# Patient Record
Sex: Male | Born: 1945
Health system: Southern US, Community
[De-identification: ages and names within clinical notes are randomized; demographics above are authoritative.]

## PROBLEM LIST (undated history)

## (undated) DIAGNOSIS — G250 Essential tremor: Secondary | ICD-10-CM

## (undated) DIAGNOSIS — E785 Hyperlipidemia, unspecified: Secondary | ICD-10-CM

## (undated) DIAGNOSIS — Z86010 Personal history of colonic polyps: Secondary | ICD-10-CM

## (undated) DIAGNOSIS — K279 Peptic ulcer, site unspecified, unspecified as acute or chronic, without hemorrhage or perforation: Secondary | ICD-10-CM

## (undated) DIAGNOSIS — Z85038 Personal history of other malignant neoplasm of large intestine: Secondary | ICD-10-CM

## (undated) DIAGNOSIS — K625 Hemorrhage of anus and rectum: Secondary | ICD-10-CM

## (undated) DIAGNOSIS — A809 Acute poliomyelitis, unspecified: Secondary | ICD-10-CM

## (undated) DIAGNOSIS — C801 Malignant (primary) neoplasm, unspecified: Secondary | ICD-10-CM

## (undated) DIAGNOSIS — N1831 Chronic kidney disease, stage 3a: Secondary | ICD-10-CM

## (undated) DIAGNOSIS — K769 Liver disease, unspecified: Secondary | ICD-10-CM

## (undated) DIAGNOSIS — G43909 Migraine, unspecified, not intractable, without status migrainosus: Secondary | ICD-10-CM

## (undated) DIAGNOSIS — I10 Essential (primary) hypertension: Secondary | ICD-10-CM

## (undated) DIAGNOSIS — E7800 Pure hypercholesterolemia, unspecified: Secondary | ICD-10-CM

## (undated) HISTORY — DX: Migraine, unspecified, not intractable, without status migrainosus: G43.909

## (undated) HISTORY — DX: Chronic kidney disease, stage 3a: N18.31

## (undated) HISTORY — DX: Essential tremor: G25.0

## (undated) HISTORY — PX: COLON SURGERY: SHX602

## (undated) HISTORY — DX: Peptic ulcer, site unspecified, unspecified as acute or chronic, without hemorrhage or perforation: K27.9

## (undated) HISTORY — DX: Hemorrhage of anus and rectum: K62.5

## (undated) HISTORY — DX: Acute poliomyelitis, unspecified: A80.9

## (undated) HISTORY — DX: Personal history of colonic polyps: Z86.010

## (undated) HISTORY — DX: Pure hypercholesterolemia, unspecified: E78.00

## (undated) HISTORY — DX: Hyperlipidemia, unspecified: E78.5

## (undated) HISTORY — DX: Liver disease, unspecified: K76.9

## (undated) HISTORY — DX: Personal history of other malignant neoplasm of large intestine: Z85.038

---

## 2006-03-24 ENCOUNTER — Inpatient Hospital Stay (HOSPITAL_COMMUNITY): Admission: EM | Admit: 2006-03-24 | Discharge: 2006-03-27 | Payer: Self-pay | Admitting: Emergency Medicine

## 2006-03-26 ENCOUNTER — Encounter (INDEPENDENT_AMBULATORY_CARE_PROVIDER_SITE_OTHER): Payer: Self-pay | Admitting: *Deleted

## 2006-06-23 ENCOUNTER — Encounter (INDEPENDENT_AMBULATORY_CARE_PROVIDER_SITE_OTHER): Payer: Self-pay | Admitting: Surgery

## 2006-06-23 ENCOUNTER — Inpatient Hospital Stay (HOSPITAL_COMMUNITY): Admission: RE | Admit: 2006-06-23 | Discharge: 2006-06-28 | Payer: Self-pay | Admitting: Surgery

## 2006-06-23 ENCOUNTER — Ambulatory Visit: Payer: Self-pay | Admitting: Internal Medicine

## 2006-06-27 ENCOUNTER — Ambulatory Visit: Payer: Self-pay | Admitting: Internal Medicine

## 2006-06-30 ENCOUNTER — Ambulatory Visit: Payer: Self-pay | Admitting: Oncology

## 2006-07-04 LAB — BUN: BUN: 18 mg/dL (ref 6–23)

## 2006-07-04 LAB — CREATININE, SERUM: Creatinine, Ser: 0.97 mg/dL (ref 0.40–1.50)

## 2006-07-08 ENCOUNTER — Ambulatory Visit (HOSPITAL_COMMUNITY): Admission: RE | Admit: 2006-07-08 | Discharge: 2006-07-08 | Payer: Self-pay | Admitting: Internal Medicine

## 2006-10-02 ENCOUNTER — Ambulatory Visit: Payer: Self-pay | Admitting: Internal Medicine

## 2006-10-06 LAB — CBC WITH DIFFERENTIAL/PLATELET
BASO%: 1.5 % (ref 0.0–2.0)
EOS%: 2.5 % (ref 0.0–7.0)
HCT: 35.9 % — ABNORMAL LOW (ref 38.7–49.9)
MCH: 23.7 pg — ABNORMAL LOW (ref 28.0–33.4)
MCHC: 32.5 g/dL (ref 32.0–35.9)
MONO#: 0.5 10*3/uL (ref 0.1–0.9)
RBC: 4.92 10*6/uL (ref 4.20–5.71)
RDW: 20.8 % — ABNORMAL HIGH (ref 11.2–14.6)
WBC: 5.8 10*3/uL (ref 4.0–10.0)
lymph#: 1.1 10*3/uL (ref 0.9–3.3)

## 2006-10-07 ENCOUNTER — Ambulatory Visit (HOSPITAL_COMMUNITY): Admission: RE | Admit: 2006-10-07 | Discharge: 2006-10-07 | Payer: Self-pay | Admitting: Internal Medicine

## 2006-10-07 LAB — COMPREHENSIVE METABOLIC PANEL
ALT: 12 U/L (ref 0–53)
AST: 14 U/L (ref 0–37)
BUN: 20 mg/dL (ref 6–23)
Calcium: 9 mg/dL (ref 8.4–10.5)
Creatinine, Ser: 0.96 mg/dL (ref 0.40–1.50)
Total Bilirubin: 0.3 mg/dL (ref 0.3–1.2)

## 2006-10-07 LAB — CEA: CEA: 1.1 ng/mL (ref 0.0–5.0)

## 2007-01-01 ENCOUNTER — Ambulatory Visit: Payer: Self-pay | Admitting: Internal Medicine

## 2007-01-05 LAB — COMPREHENSIVE METABOLIC PANEL
Albumin: 4 g/dL (ref 3.5–5.2)
BUN: 21 mg/dL (ref 6–23)
Calcium: 9.4 mg/dL (ref 8.4–10.5)
Chloride: 107 mEq/L (ref 96–112)
Creatinine, Ser: 0.99 mg/dL (ref 0.40–1.50)
Glucose, Bld: 88 mg/dL (ref 70–99)
Potassium: 4.4 mEq/L (ref 3.5–5.3)

## 2007-01-05 LAB — CBC WITH DIFFERENTIAL/PLATELET
Basophils Absolute: 0.1 10*3/uL (ref 0.0–0.1)
EOS%: 1.5 % (ref 0.0–7.0)
Eosinophils Absolute: 0.1 10*3/uL (ref 0.0–0.5)
HCT: 41.6 % (ref 38.7–49.9)
HGB: 14.1 g/dL (ref 13.0–17.1)
MCH: 27.2 pg — ABNORMAL LOW (ref 28.0–33.4)
MCV: 80.2 fL — ABNORMAL LOW (ref 81.6–98.0)
NEUT#: 3.8 10*3/uL (ref 1.5–6.5)
NEUT%: 67.4 % (ref 40.0–75.0)
RDW: 19.1 % — ABNORMAL HIGH (ref 11.2–14.6)
lymph#: 1.1 10*3/uL (ref 0.9–3.3)

## 2007-01-05 LAB — CEA: CEA: 0.7 ng/mL (ref 0.0–5.0)

## 2007-04-06 ENCOUNTER — Ambulatory Visit: Payer: Self-pay | Admitting: Internal Medicine

## 2007-04-08 ENCOUNTER — Ambulatory Visit (HOSPITAL_COMMUNITY): Admission: RE | Admit: 2007-04-08 | Discharge: 2007-04-08 | Payer: Self-pay | Admitting: Internal Medicine

## 2007-04-08 LAB — CBC WITH DIFFERENTIAL/PLATELET
Basophils Absolute: 0.1 10*3/uL (ref 0.0–0.1)
EOS%: 3.2 % (ref 0.0–7.0)
Eosinophils Absolute: 0.2 10*3/uL (ref 0.0–0.5)
HCT: 43.5 % (ref 38.7–49.9)
HGB: 14.8 g/dL (ref 13.0–17.1)
MCH: 28.8 pg (ref 28.0–33.4)
MCV: 84.8 fL (ref 81.6–98.0)
NEUT#: 3.6 10*3/uL (ref 1.5–6.5)
NEUT%: 60.5 % (ref 40.0–75.0)
lymph#: 1.6 10*3/uL (ref 0.9–3.3)

## 2007-04-08 LAB — COMPREHENSIVE METABOLIC PANEL
AST: 23 U/L (ref 0–37)
Albumin: 3.7 g/dL (ref 3.5–5.2)
BUN: 17 mg/dL (ref 6–23)
Calcium: 9.4 mg/dL (ref 8.4–10.5)
Chloride: 102 mEq/L (ref 96–112)
Creatinine, Ser: 1.03 mg/dL (ref 0.40–1.50)
Glucose, Bld: 94 mg/dL (ref 70–99)

## 2007-04-08 LAB — CEA: CEA: <0.5 ng/mL (ref 0.0–5.0)

## 2007-09-30 ENCOUNTER — Ambulatory Visit: Payer: Self-pay | Admitting: Internal Medicine

## 2007-10-05 ENCOUNTER — Ambulatory Visit (HOSPITAL_COMMUNITY): Admission: RE | Admit: 2007-10-05 | Discharge: 2007-10-05 | Payer: Self-pay | Admitting: Internal Medicine

## 2007-10-05 LAB — CBC WITH DIFFERENTIAL/PLATELET
BASO%: 0.8 % (ref 0.0–2.0)
Basophils Absolute: 0 10*3/uL (ref 0.0–0.1)
EOS%: 1.5 % (ref 0.0–7.0)
HGB: 15.6 g/dL (ref 13.0–17.1)
MCH: 29.7 pg (ref 28.0–33.4)
MCHC: 34.3 g/dL (ref 32.0–35.9)
RDW: 14.2 % (ref 11.2–14.6)
WBC: 6.1 10*3/uL (ref 4.0–10.0)
lymph#: 1 10*3/uL (ref 0.9–3.3)

## 2007-10-05 LAB — COMPREHENSIVE METABOLIC PANEL
ALT: 17 U/L (ref 0–53)
AST: 20 U/L (ref 0–37)
Albumin: 3.6 g/dL (ref 3.5–5.2)
Calcium: 8.8 mg/dL (ref 8.4–10.5)
Chloride: 102 mEq/L (ref 96–112)
Potassium: 3.7 mEq/L (ref 3.5–5.3)
Total Protein: 7 g/dL (ref 6.0–8.3)

## 2008-04-06 ENCOUNTER — Ambulatory Visit: Payer: Self-pay | Admitting: Internal Medicine

## 2008-04-08 ENCOUNTER — Ambulatory Visit (HOSPITAL_COMMUNITY): Admission: RE | Admit: 2008-04-08 | Discharge: 2008-04-08 | Payer: Self-pay | Admitting: Internal Medicine

## 2008-04-08 LAB — COMPREHENSIVE METABOLIC PANEL
ALT: 18 U/L (ref 0–53)
CO2: 29 mEq/L (ref 19–32)
Calcium: 8.9 mg/dL (ref 8.4–10.5)
Chloride: 101 mEq/L (ref 96–112)
Creatinine, Ser: 0.97 mg/dL (ref 0.40–1.50)
Glucose, Bld: 99 mg/dL (ref 70–99)

## 2008-04-08 LAB — CBC WITH DIFFERENTIAL/PLATELET
Basophils Absolute: 0.1 10*3/uL (ref 0.0–0.1)
Eosinophils Absolute: 0.1 10*3/uL (ref 0.0–0.5)
HCT: 46.4 % (ref 38.4–49.9)
HGB: 15.6 g/dL (ref 13.0–17.1)
MONO#: 0.4 10*3/uL (ref 0.1–0.9)
NEUT%: 60.7 % (ref 39.0–75.0)
WBC: 4.4 10*3/uL (ref 4.0–10.3)
lymph#: 1.2 10*3/uL (ref 0.9–3.3)

## 2008-04-08 LAB — CEA: CEA: <0.5 ng/mL (ref 0.0–5.0)

## 2008-10-05 ENCOUNTER — Ambulatory Visit: Payer: Self-pay | Admitting: Internal Medicine

## 2008-10-07 LAB — CBC WITH DIFFERENTIAL/PLATELET
BASO%: 1.2 % (ref 0.0–2.0)
Eosinophils Absolute: 0.1 10*3/uL (ref 0.0–0.5)
HCT: 42.9 % (ref 38.4–49.9)
LYMPH%: 20.2 % (ref 14.0–49.0)
MCHC: 34.5 g/dL (ref 32.0–36.0)
MCV: 86.7 fL (ref 79.3–98.0)
MONO#: 0.4 10*3/uL (ref 0.1–0.9)
MONO%: 8 % (ref 0.0–14.0)
NEUT%: 68.7 % (ref 39.0–75.0)
Platelets: 192 10*3/uL (ref 140–400)
WBC: 5.4 10*3/uL (ref 4.0–10.3)

## 2008-10-07 LAB — COMPREHENSIVE METABOLIC PANEL
CO2: 26 mEq/L (ref 19–32)
Creatinine, Ser: 0.97 mg/dL (ref 0.40–1.50)
Glucose, Bld: 93 mg/dL (ref 70–99)
Total Bilirubin: 0.6 mg/dL (ref 0.3–1.2)

## 2008-10-07 LAB — CEA: CEA: 1.3 ng/mL (ref 0.0–5.0)

## 2009-03-22 ENCOUNTER — Ambulatory Visit: Payer: Self-pay | Admitting: Internal Medicine

## 2009-03-24 ENCOUNTER — Ambulatory Visit (HOSPITAL_COMMUNITY): Admission: RE | Admit: 2009-03-24 | Discharge: 2009-03-24 | Payer: Self-pay | Admitting: Internal Medicine

## 2009-03-24 LAB — CBC WITH DIFFERENTIAL/PLATELET
Basophils Absolute: 0.1 10*3/uL (ref 0.0–0.1)
EOS%: 2 % (ref 0.0–7.0)
Eosinophils Absolute: 0.1 10*3/uL (ref 0.0–0.5)
HGB: 15.4 g/dL (ref 13.0–17.1)
MCH: 28.8 pg (ref 27.2–33.4)
MCV: 86.9 fL (ref 79.3–98.0)
MONO%: 9.5 % (ref 0.0–14.0)
NEUT#: 3 10*3/uL (ref 1.5–6.5)
RBC: 5.34 10*6/uL (ref 4.20–5.82)
RDW: 14 % (ref 11.0–14.6)
lymph#: 1.4 10*3/uL (ref 0.9–3.3)

## 2009-03-24 LAB — COMPREHENSIVE METABOLIC PANEL
AST: 22 U/L (ref 0–37)
Albumin: 4 g/dL (ref 3.5–5.2)
Alkaline Phosphatase: 53 U/L (ref 39–117)
BUN: 15 mg/dL (ref 6–23)
Calcium: 9.2 mg/dL (ref 8.4–10.5)
Chloride: 101 mEq/L (ref 96–112)
Potassium: 4.2 mEq/L (ref 3.5–5.3)
Sodium: 136 mEq/L (ref 135–145)
Total Protein: 7.5 g/dL (ref 6.0–8.3)

## 2009-10-02 ENCOUNTER — Ambulatory Visit: Payer: Self-pay | Admitting: Internal Medicine

## 2009-10-23 LAB — CBC WITH DIFFERENTIAL/PLATELET
Basophils Absolute: 0 10*3/uL (ref 0.0–0.1)
Eosinophils Absolute: 0.1 10*3/uL (ref 0.0–0.5)
HCT: 42.6 % (ref 38.4–49.9)
HGB: 14.6 g/dL (ref 13.0–17.1)
MCH: 29.9 pg (ref 27.2–33.4)
MONO#: 0.4 10*3/uL (ref 0.1–0.9)
NEUT#: 4 10*3/uL (ref 1.5–6.5)
NEUT%: 72.2 % (ref 39.0–75.0)
RDW: 14 % (ref 11.0–14.6)
WBC: 5.6 10*3/uL (ref 4.0–10.3)
lymph#: 1 10*3/uL (ref 0.9–3.3)

## 2009-10-23 LAB — COMPREHENSIVE METABOLIC PANEL
AST: 23 U/L (ref 0–37)
Albumin: 3.7 g/dL (ref 3.5–5.2)
BUN: 17 mg/dL (ref 6–23)
CO2: 30 mEq/L (ref 19–32)
Calcium: 9.2 mg/dL (ref 8.4–10.5)
Chloride: 105 mEq/L (ref 96–112)
Creatinine, Ser: 1.22 mg/dL (ref 0.40–1.50)
Potassium: 4 mEq/L (ref 3.5–5.3)

## 2009-10-23 LAB — CEA: CEA: 0.8 ng/mL (ref 0.0–5.0)

## 2010-02-09 ENCOUNTER — Other Ambulatory Visit: Payer: Self-pay | Admitting: Internal Medicine

## 2010-02-09 DIAGNOSIS — C189 Malignant neoplasm of colon, unspecified: Secondary | ICD-10-CM

## 2010-04-20 ENCOUNTER — Encounter (HOSPITAL_COMMUNITY): Payer: Self-pay

## 2010-04-20 ENCOUNTER — Other Ambulatory Visit: Payer: Self-pay | Admitting: Internal Medicine

## 2010-04-20 ENCOUNTER — Ambulatory Visit (HOSPITAL_COMMUNITY)
Admission: RE | Admit: 2010-04-20 | Discharge: 2010-04-20 | Disposition: A | Discharge status: Home / Self Care | Payer: Managed Care, Other (non HMO) | Source: Ambulatory Visit | Attending: Internal Medicine | Admitting: Internal Medicine

## 2010-04-20 ENCOUNTER — Encounter (HOSPITAL_BASED_OUTPATIENT_CLINIC_OR_DEPARTMENT_OTHER): Payer: Managed Care, Other (non HMO) | Admitting: Internal Medicine

## 2010-04-20 DIAGNOSIS — Z87891 Personal history of nicotine dependence: Secondary | ICD-10-CM | POA: Insufficient documentation

## 2010-04-20 DIAGNOSIS — R1012 Left upper quadrant pain: Secondary | ICD-10-CM | POA: Insufficient documentation

## 2010-04-20 DIAGNOSIS — C189 Malignant neoplasm of colon, unspecified: Secondary | ICD-10-CM | POA: Insufficient documentation

## 2010-04-20 DIAGNOSIS — I10 Essential (primary) hypertension: Secondary | ICD-10-CM | POA: Insufficient documentation

## 2010-04-20 DIAGNOSIS — M47817 Spondylosis without myelopathy or radiculopathy, lumbosacral region: Secondary | ICD-10-CM | POA: Insufficient documentation

## 2010-04-20 DIAGNOSIS — C187 Malignant neoplasm of sigmoid colon: Secondary | ICD-10-CM

## 2010-04-20 HISTORY — DX: Essential (primary) hypertension: I10

## 2010-04-20 HISTORY — DX: Malignant (primary) neoplasm, unspecified: C80.1

## 2010-04-20 LAB — CBC WITH DIFFERENTIAL/PLATELET
BASO%: 1.8 % (ref 0.0–2.0)
EOS%: 2.2 % (ref 0.0–7.0)
LYMPH%: 20.5 % (ref 14.0–49.0)
MCH: 29 pg (ref 27.2–33.4)
MCHC: 33.5 g/dL (ref 32.0–36.0)
MCV: 86.5 fL (ref 79.3–98.0)
MONO%: 11 % (ref 0.0–14.0)
Platelets: 179 10*3/uL (ref 140–400)
RBC: 5.28 10*6/uL (ref 4.20–5.82)
RDW: 14.4 % (ref 11.0–14.6)

## 2010-04-20 LAB — CMP (CANCER CENTER ONLY)
ALT(SGPT): 23 U/L (ref 10–47)
AST: 24 U/L (ref 11–38)
Alkaline Phosphatase: 56 U/L (ref 26–84)
Glucose, Bld: 100 mg/dL (ref 73–118)
Potassium: 4.3 mEq/L (ref 3.3–4.7)
Sodium: 140 mEq/L (ref 128–145)
Total Bilirubin: 0.7 mg/dl (ref 0.20–1.60)
Total Protein: 7.2 g/dL (ref 6.4–8.1)

## 2010-04-20 MED ORDER — IOHEXOL 300 MG/ML  SOLN
100.0000 mL | Freq: Once | INTRAMUSCULAR | Status: AC | PRN
  Administered 2010-04-20: 100 mL via INTRAVENOUS

## 2010-05-02 ENCOUNTER — Encounter (HOSPITAL_BASED_OUTPATIENT_CLINIC_OR_DEPARTMENT_OTHER): Payer: Managed Care, Other (non HMO) | Admitting: Internal Medicine

## 2010-05-02 DIAGNOSIS — C187 Malignant neoplasm of sigmoid colon: Secondary | ICD-10-CM

## 2010-05-02 DIAGNOSIS — M47817 Spondylosis without myelopathy or radiculopathy, lumbosacral region: Secondary | ICD-10-CM

## 2010-06-05 NOTE — Discharge Summary (Signed)
[name], [name]                ACCOUNT NO.:  [id number]   MEDICAL RECORD NO.:  [id number]          PATIENT TYPE:  INP   LOCATION:  1528                         FACILITY:  [facility]   PHYSICIAN:  Lajuana Matte, MD  DATE OF BIRTH:  [birth year]   DATE OF ADMISSION:  06/23/2006  DATE OF DISCHARGE:                               DISCHARGE SUMMARY   REASON FOR CONSULTATION:  Colon cancer.   HISTORY OF PRESENT ILLNESS:  [name] is a delightful 65 year old white  male, found initially to have adenocarcinoma per colonoscopy on Jun 02, 2006, as he was being evaluated for Hemoccult positive stools and  anemia.  Of note, it is important to mention that he had been evaluated  for anemia back in March of 2008 for an upper GI bleed with upper  endoscopy positive for PUD, but not malignancy.  Once the diagnosis of  adenocarcinoma was made, he was referred to surgical consultation,  undergoing a sigmoid colectomy on June 23, 2006, with pathology positive  for adenocarcinoma, grade II, T3, N0, MX, negative margins, distance  from the nearest margin 4 cm, no perforation of the visceral peritoneum,  invasive into the pericolonic adipose tissue.  No vascular or lymphatic  invasion.  Twenty lymph nodes negative.  To this date, no CTs are  available for review.   We were asked to see the patient in consultation with recommendations  regarding his care.   PAST MEDICAL HISTORY:  1. Recent NSAID-related gastric ulcer diagnosed in March of 2008 with      acute hemorrhagic anemia.  2. History of recent right eye blepharitis, resolved to this date.  3. Hypertension.  4. Chronic migraine headaches with negative CT in March of 2008.  5. Syncopal episode in March of 2008 with negative work-up from the      cardiology standpoint in March of 2008.  6. History of polio at age 89.  7. Cataract surgery.  8. Status post sigmoid colectomy, Dr. Gerrit Friends, June 23, 2006.   ALLERGIES:  No known drug allergies.   CURRENT MEDICATIONS:  1. Lovenox.  2. Morphine sulfate.  3. Protonix.  4. Benadryl.  5. Narcan.  6. Zofran.  7. Compazine.   REVIEW OF SYSTEMS:  Remarkable for chronic headaches, dyspnea on  exertion secondary to anemia, blood in the stools but not microscopic,  recent syncopal episode if March of 2008 with negative cardiac work-up.  The rest of the review of systems is negative.  The patient denies any  abdominal pain, other than the incisional pain.   FAMILY HISTORY:  Mother died with myocardial infarction.  She had a  history of lung cancer.  Father died with heart disease.  He had a  history of prostate cancer.  One brother alive with a history of stroke.  One brother alive with a history of cancer of unknown type.  No history  of colon cancer in the family.   SOCIAL HISTORY:  The patient is married.  He has 3 children in good  health, ages 63, 22 and 67 22 and 63.  He is a Hospital doctor for  [employer].  No  alcohol history; he quit 18 years ago.  The use of one pack a day of  cigarettes for at 27 years.  Lives in [place].   PHYSICAL EXAMINATION:  GENERAL:  This is a 65 year old well-developed  white male in no acute distress, alert and oriented x3.  VITAL SIGNS:  Blood pressure 144/80, pulse 76, respirations 20,  temperature 97.8, pulse oximetry 99% on room air, weight 174 pounds.  HEENT:  Normocephalic and atraumatic.  Pupils equal, round and reactive  to light.  Oral mucosa without thrush or lesions.  NECK:  Supple.  No cervical or supraclavicular masses.  LUNGS:  Clear to auscultation bilaterally.  No axillary masses.  CARDIOVASCULAR:  Regular rate and rhythm without murmurs, rubs, or  gallops.  ABDOMEN:  Distended, tympanic.  Bowel sounds distant but present.  Resolving ileus.  The staples do not show any surrounding erythema.  No  palpable spleen or liver although the exam is limited due to the  distention.  GU/RECTAL:  Deferred.  EXTREMITIES:  Very mild clubbing.  No  cyanosis or edema.  SKIN:  Without other lesions.  No bruising or petechial rash.  NEUROLOGIC:  Nonfocal.   LABORATORY DATA:  Hemoglobin 9.9, hematocrit 31.7, white count 9.7,  platelets 259, neutrophils 3.2, MCV 68.3, PT 13.3, INR 1.0.  Sodium 140,  potassium 4.0, BUN 4, creatinine 0.96, glucose 107, CEA 0.9  preoperatively.   RADIOLOGICAL STUDIES:  None during this admission.  CT of the head in  March of 2008 was negative.  Chest x-ray in March of 2008 was negative  for any suspicious masses.   ASSESSMENT AND PLAN:  Dr. Arbutus Ped has seen and evaluated the patient,  and the chart has been reviewed.  [name] is a pleasant 65 year old  white male recently diagnosed with stage IIA, T3, N0, MX colon  adenocarcinoma status post sigmoid colon resection with dissection of 20  lymph nodes, all negative for malignancy.  This was done on June 23, 2006.  The patient is recovering well from his surgery.  The patient has  no staging work-up to this date.   RECOMMENDATIONS:  1. Will arrange a follow up appointment for [name] with Dr. Arbutus Ped      at the [facility] in 4 weeks for a more detailed      discussion of adjuvant chemotherapy versus just observation, as the      patient has significant risk factors.  2. Will complete the staging work-up ordering a CT of the chest,      abdomen and pelvis before the next      appointment.  3. Will continue routine follow up with serial CEA and scans after his      next visit.   Thank you very much for allowing [place] the opportunity to participate in  the care of [name].      Marlowe Kays, P.A.      Lajuana Matte, MD  Electronically Signed    SW/MEDQ  D:  06/26/2006  T:  06/26/2006  Job:  [id number]   cc:   Melida Quitter, M.D.  Fax: [phone]

## 2010-06-05 NOTE — Op Note (Signed)
[name], [name]                ACCOUNT NO.:  [id number]   MEDICAL RECORD NO.:  [id number]          PATIENT TYPE:  INP   LOCATION:  0001                         FACILITY:  [facility]   PHYSICIAN:  Velora Heckler, MD      DATE OF BIRTH:  1945/10/05   DATE OF PROCEDURE:  06/23/2006  DATE OF DISCHARGE:                               OPERATIVE REPORT   PREOPERATIVE DIAGNOSIS:  Sigmoid colon carcinoma.   POSTOPERATIVE DIAGNOSIS:  Sigmoid colon carcinoma.   PROCEDURE:  Sigmoid colectomy.   SURGEON:  Velora Heckler, MD, FACS   ANESTHESIA:  General per Dr. Ronelle Nigh.   PREPARATION:  Betadine.   COMPLICATIONS:  None.   INDICATIONS:  The patient is a 65 year old white male from [place], [place].  The patient had a syncopal episode March 2008.  He was admitted and found to be anemic.  He underwent upper and lower  endoscopies.  Colonoscopy on Jun 02, 2006, demonstrated a  circumferential lesion in the proximal sigmoid colon.  Biopsy showed  adenocarcinoma.  The patient comes to surgery for resection.   OPERATIVE REPORT:  Procedure was done in OR #6 at the [facility].  The patient was brought to the operating room,  placed in a supine position on the operating room table.  Following  administration of general anesthesia, the patient was prepped and draped  in usual strict aseptic fashion.  After ascertaining that an adequate  level of anesthesia had been obtained, a low midline surgical wound was  made with a #10 blade.  Dissection was carried from the umbilicus to the  pubis in the midline.  Dissection was carried through subcutaneous  tissues.  Fascia was incised in the midline.  The peritoneal cavity was  entered cautiously.  Abdomen was explored.  Liver appears normal.  Omentum is normal.  In the left lower abdomen there is an obvious large  mass in what was apparently the proximal sigmoid colon.  It is adherent  to the abdominal peritoneum at  the left lateral sidewall.  A Balfour  retractor was placed for exposure.  Peritoneum was incised laterally and  the sigmoid colon was mobilized.  A segment of peritoneum was excised  with the electrocautery to be included en bloc with the tumor in order  to get a margin, as it appears to be locally invasive into the abdominal  sidewall.  Descending colon was mobilized along its lateral peritoneal  attachments.  Mesentery is elevated and the retroperitoneum was  explored, identifying the ureter and gonadal vessels.  A point in the  distal sigmoid colon is selected and then transected with a GIA stapler.  The mesentery is divided with the LigaSure.  Major vessels are ligated  with 2-0 silk ties and then divided with the LigaSure.  Dissection was  carried proximally.  A point in the distal descending colon is selected.  Stay sutures were placed.  It was then transected between bowel clamps.  Specimen is submitted to pathology where Dr. Charlott Rakes examined the  specimen while fresh.  He  notes a 4.5-cm proximal margin and a large  distal margin.  Two additional polyps were identified with reasonable  surgical margins within the specimen.   Next an end-to-end anastomosis is created between the distal descending  colon and the distal sigmoid colon.  The staple line from the distal  sigmoid colon is excised.  End-to-end anastomosis was performed with  interrupted 2-0 silk sutures circumferentially.  No tension is present  on the anastomosis.  Bowel wall proximally and distally appears healthy  and viable and is well approximated.  Good hemostasis is noted.   The decision was made to proceed with incidental appendectomy.  The  appendix was somewhat retrocecal.  It is gently mobilized.  Mesoappendix  was taken down with the LigaSure.  Base of the appendix was suture-  ligated with a 3-0 silk suture ligature and the appendix was transected  with the LigaSure.  Appendiceal stump was then inverted  with the 3-0  silk Z stitch.  Abdomen was then copiously irrigated with warm saline.  This was evacuated.  Good hemostasis is noted.  Omentum is used to cover  the bowel.  All packs were removed.  Instrument counts are correct.  Midline wound is closed with a running #1 Novofil suture.  Subcutaneous  tissue is irrigated.  The skin is closed with stainless steel staples.  Sterile dressings were applied.  The patient is awakened from anesthesia  and brought to the recovery room in stable condition.  The patient  tolerated the procedure well.      Velora Heckler, MD  Electronically Signed     TMG/MEDQ  D:  06/23/2006  T:  06/23/2006  Job:  [id number]   cc:   Melida Quitter, M.D.  Fax: [phone]   Petra Kuba, M.D.  Fax: [phone]

## 2010-06-08 NOTE — H&P (Signed)
[name], [name]                ACCOUNT NO.:  [id number]   MEDICAL RECORD NO.:  [id number]          PATIENT TYPE:  INP   LOCATION:  6732                         FACILITY:  [facility]   PHYSICIAN:  Kela Millin, M.D.DATE OF BIRTH:  1945/07/15   DATE OF ADMISSION:  03/24/2006  DATE OF DISCHARGE:                              HISTORY & PHYSICAL   PRIMARY CARE PHYSICIAN:  Dr. Johny [name].   CHIEF COMPLAINT:  Syncopal episode.   HISTORY OF PRESENT ILLNESS:  The patient is a 65 year old white male  with past medical history significant for hypertension, brought to the  ER following a syncopal episode.  The patient reports that he was  helping load a truck (emphasizes that he was not doing any of the  lifting or other physical exertion) and that it felt quite hot in the  trailer.  He remembers feeling weak, dizzy and sweating and the next  thing he knew he was looking up from the ground with people around him.  He states that from what he was told he lost consciousness for a few  seconds, no seizure activity, also no urinary or stool incontinence  reported.  He denies chest pain, shortness of breath, cough, fevers,  dysuria, melena, nausea, vomiting and no hematochezia.  The patient also  denies shortness of breath, PND and no leg swelling.  He states that in  October of 2007 he had cardiac workup done by Dr. Eldridge [name] that included  a stress test and echocardiogram and this was within normal limits.   Per EMS report, the patient was orthostatic on the scene.  Blood  pressure of 192/60 lying and blood pressure of 78/40 standing.  The  patient was given a bolus of IV fluids and transferred to the ER.  In  the ER, he had a CT scan of his head which was negative and a chest x-  ray showed mild left base subsegmental atelectasis.  The patient also  had a CBC done and the hemoglobin was noted to be elevated at 8.6 with a  hematocrit of 27.6.  The patient's BUN was elevated at 37 with a  creatinine of 1.2.  He was admitted to the [facility] service for  further evaluation and management.   PAST MEDICAL HISTORY:  As stated above.   MEDICATIONS:  Micardis, HCT, aspirin.  The patient does not know the  doses of his medications.   ALLERGIES:  NKDA.   SOCIAL HISTORY:  He quit smoking 17-18 years ago.  Occasional alcohol.   FAMILY HISTORY:  His mother had a MI age unknown, also had lung cancer.  His father had heart disease as well.   REVIEW OF SYSTEMS:  As per HPI.  Other review of systems negative.   PHYSICAL EXAMINATION:  GENERAL:  The patient is a pleasant middle-aged  white male.  He is alert and appropriate in no apparent distress.  VITAL SIGNS:  Blood pressure 104/60 (initially it was 112/60 lying and  95/62 sitting), pulse is 75, respiratory rate is 18 and O2 saturation is  100%.  HEENT:  PERRL.  EOMI.  Dry mucus membranes.  No oral exudates.  NECK:  Supple.  No adenopathy and no thyromegaly.  LUNGS:  Decreased breath sounds at the bases.  Otherwise clear to  auscultation bilaterally.  CARDIOVASCULAR:  Regular rate and rhythm.  Normal S1-S2.  ABDOMEN:  Obese, soft.  Bowel sounds present, nontender, nondistended.  No organomegaly and no masses palpable.  EXTREMITIES:  No cyanosis and no edema.  NEUROLOGICAL:  She is alert and oriented x3.  Cranial nerves II through  XII grossly intact.  Nonfocal exam.   LABORATORY DATA:  CT scan of head-negative.  Chest x-ray-mild left base  subsegmental atelectasis.  Creatinine is 1.2, BUN 37.  Sodium 137,  potassium 4.7, chloride is 107, glucose 96, pH is 7.32 and pCO2 is 44.2.  INR is 1.1 and his d-dimer is less than 0.22.  His white cell count is  14.7 and hemoglobin is 8.6.  Hematocrit is 27.8.  Platelet count is 262.  Neutrophil count 87%.  B-type natriuretic peptide is less than 30.  Point of care markers negative.   ASSESSMENT/PLAN:  1. Syncope:  Likely secondary to orthostatic hypotension.  Also noted       that the patient is anemic.  We will obtain stool guaiacs and      recheck complete blood count.  As discussed above, the patient had      a full cardiac workup in October 2007 and this was negative per the      patient's report.  We will obtain cardiac enzymes and follow.  2. Anemia, microcytic:  Anemia workup, stool guaiacs, type and screen,      manage her hemoglobin and hematocrit and transfuse as clinically      appropriate.  Follow and consult gastroenterology pending above      studies.  3. Azotemia, volume depletion:  Hydrate, transfuse as appropriate.  4. History of hypertension:  Monitor blood pressures.  Hold off blood      pressure medications at this time.  Monitor and resume when      appropriate.  5. Leukocytosis:  We will follow and repeat the chest x-ray.  Obtain      urinalysis.  Questionable stress demargination.  Follow and manage      as appropriate.      Kela Millin, M.D.  Electronically Signed     ACV/MEDQ  D:  03/25/2006  T:  03/25/2006  Job:  [id number]   cc:   [name], M.D.

## 2010-06-08 NOTE — Discharge Summary (Signed)
[name], [name]                ACCOUNT NO.:  [id number]   MEDICAL RECORD NO.:  [id number]          PATIENT TYPE:  INP   LOCATION:  6732                         FACILITY:  [facility]   PHYSICIAN:  Hollice Espy, M.D.DATE OF BIRTH:  01-16-1946   DATE OF ADMISSION:  03/24/2006  DATE OF DISCHARGE:  03/27/2006                               DISCHARGE SUMMARY   The patient's primary care physician is Doris Cheadle. Foy Guadalajara, M.D.   Consultant on the case was Petra Kuba, M.D., of [facility].   DISCHARGE DIAGNOSES:  1. Nonsteroidal anti-inflammatory drug-induced gastric ulcer.  2. Acute hemorrhagic anemia secondary to #1.  3. Hypertension.   DISCHARGE MEDICATIONS:  The patient will continue on his Cardizem.  He  is advised not to restart his Cardizem until Sunday, March 30, 2006.  He  is being discharged on Prilosec OTC one p.o. b.i.d. x2 weeks, then one  p.o. daily.   FOLLOW-UP APPOINTMENTS:  He will see his PCP as well as Dr. Ewing Schlein in the  next 2 weeks.   His activity is slow to increase.  He is cleared to return back to work  with light activity starting on Monday, March 31, 2006.   His discharge diet is a low-sodium diet.   HOSPITAL COURSE:  The patient is a 65 year old white male with past  medical history of hypertension, who takes p.r.n. Goody Powders for  pain, when he had a syncopal episode on the day of admission.  He had  had no previous history of ulcer and came in and was found to have a low  hemoglobin of 6.  He was started on IV fluids and transfused and [facility] was asked to consult.  The patient underwent an EGD on March 28, 2006,  and was found to have a healing gastric ulcer.  This was secondary to  NSAID inducement.  Following the procedure he was doing well.  He was  put on a low-sodium diet, which he tolerated well.  This was advanced.  His H&H's were followed and by day of discharge his BUN had improved  down to 13, which was initially up from 22 but his hemoglobin  had  slightly dropped down.  This was felt to be maybe more dilutional given  the degree of IV fluids that he was getting.  I had a long discussion  with the patient and his wife about the avoidance of any type of NSAIDs,  NSAID powders, ibuprofen, Motrin, or any other NSAID component, and to  use Tylenol/acetaminophen only for any type of pain issues.  They  understood.  I also advised the patient that should he start having  recurrence of black, tarry stool or it  should increase in nature, he is to return to the ER immediately for  evaluation of his hemoglobin levels.  He does understand and will do as  advised.  He will be discharged on a PPI.   DISPOSITION:  Improved.  He is being discharged to home.      Hollice Espy, M.D.  Electronically Signed  SKK/MEDQ  D:  03/27/2006  T:  03/27/2006  Job:  [id number]   cc:   Petra Kuba, M.D.  Robert L. Foy Guadalajara, M.D.

## 2010-06-08 NOTE — Op Note (Signed)
NAMEELYJAH, HAZAN NO.:  [id number]   MEDICAL RECORD NO.:  [id number]          PATIENT TYPE:  INP   LOCATION:  6732                         FACILITY:  [facility]   PHYSICIAN:  Petra Kuba, M.D.    DATE OF BIRTH:  August 07, 1945   DATE OF PROCEDURE:  03/26/2006  DATE OF DISCHARGE:                               OPERATIVE REPORT   SURGEON:  Petra Kuba, M.D.   PROCEDURE:  Esophagogastroduodenoscopy with biopsy.   INDICATIONS:  Guaiac positive anemia.  Patient on aspirin and Coumadin.  Consent was signed after risks, benefits, methods and options were  thoroughly discussed prior to any sedation given.   MEDICINES USED:  Fentanyl 75 mcg, Versed 7.5 mg.   DESCRIPTION OF PROCEDURE:  The video endoscope was inserted by direct  vision.  The esophagus was normal.  He did have a tiny hiatal hernia.  The scope was passed into the stomach and advanced to the antrum, where  multiple shallow antral ulcers and erosions were seen.  There were no  signs of bleeding.  The scope was inserted through a normal pylorus into  the duodenal bulb, where 2 distal small bulb ulcers were seen with nice  flat white bases; and around the C loop, in the middle of the C loop was  a slightly deeper ulcer, which had a red spot in it, but no obvious  visible vessel.  It could not be made to bleed with washing.  The scope  was further advanced around a second, and possibly a third part of the  duodenum.  No blood was seen distally.  The scope was slowly withdrawn  through the C loop and the bulb, and, again, the ulcers were washed and  watched and could not be made to bleed.  Once back in the stomach, the  scope was retroflexed.  The angularis, cardia, fundus, lesser and  greater curves were normal on straight and retroflexed visualization.  We went ahead and took 2 biopsies of the antrum and 2 of the proximal  stomach to rule out Helicobacter.  The scope was reinserted into the  duodenum, and,  again, the ulcers, particularly the one in the cutting  loop was washed and watched.  It could not be made to bleed.  We elected  not to proceed with any therapy at this time.  The scope was withdrawn  back to the stomach, which, again, was reevaluated on straight and  retroflex visualization without additional findings.  Air was suctioned,  the scope slowly withdrawn again, and a good look at the esophagus was  normal.  The scope was removed.  The patient tolerated the procedure  well.  There was no obvious immediate complication.   The proximal and midesophagus was normal.  The scope passed into the  stomach and advanced through a normal antrum, normal pylorus into a  normal duodenal bulb, and around the C loop to a normal second portion  of the duodenum.  The scope was withdrawn back to the bulb, and a good  look there ruled out abnormalities in that location.  The scope was  withdrawn back to the stomach and retroflexed.  The cardia, fundus,  angularis, lesser and greater curve were normal on retroflex  visualization.  Straight visualization of the stomach did not reveal any  additional findings.  Again, the scope was slowly withdrawn, confirming  a normal esophagus.  We did readvance 1 more time into the stomach,  again confirming the above findings.  The air was suctioned and the  scope removed.  The patient tolerated the procedure well.  There was no  obvious immediate complication.   ENDOSCOPIC DIAGNOSES:  1. Tiny hiatal hernia.  2. Multiple antral erosions and small shallow ulcers.  3. Two bulb small shallow ulcers, and a C-loop, slightly deeper ulcer      with a red spot, could not be made to bleed with washing.  4. Otherwise, normal to the second and possibly the third part of the      duodenum, without any blood being seen, status post antral and      proximal stomach biopsies to rule out Helicobacter.   PLAN:  1. Await pathology; treat Helicobacter if positive.  2. In  the meantime, b.i.d. pump inhibitors for 2 weeks, and then once      per day until repeat EGD in 2 months to document healing.  No      aspirin or nonsteroidals in the meantime.  Consider headache      medicines like Tylenol, Vicodin, Ultram, Darvocet, Midrin, Purinol,      etc.  3. We will proceed with screening colonoscopy at the same time as      repeat endoscopy in 2 months, unless needed sooner p.r.n.           ______________________________  Petra Kuba, M.D.     MEM/MEDQ  D:  03/26/2006  T:  03/26/2006  Job:  [id number]   cc:   Billee Cashing, MD  Holley Bouche, M.D.

## 2010-06-08 NOTE — Consult Note (Signed)
NAMEEISEN, Jeffrey NO.:  [id number]   MEDICAL RECORD NO.:  [id number]          PATIENT TYPE:  INP   LOCATION:  6732                         FACILITY:  [facility]   PHYSICIAN:  Petra Kuba, M.D.    DATE OF BIRTH:  [birth year]   DATE OF CONSULTATION:  03/26/2006  DATE OF DISCHARGE:                                 CONSULTATION   We were asked to see Mr. Mahnke today for anemia and guaiac positive  stool by Kela Millin, M.D. of [place] hospitalists.   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  He reports using approximately 3 Goody  Powders per day plus a daily aspirin.  The Goody Powders are used for  his chronic headaches.  The aspirin was prescribed to him for his heart  by his primary care doctor.  Yesterday he started having melena,  developed syncope and came to the ER.  He reports no dysphasia, no GERD,  no abdominal pain, no hematochezia or bright red blood per rectum.  He  does say that he has experienced gas pains on a daily basis and a modest  change in the regularity of his bowel movements in that they are not  every morning like they used to be.  They may be in the afternoon or  every other day.  He describes no weight loss, fever or other illnesses.  He has had no previous EGD or colonoscopy.  He is not on any  anticoagulation.  He has had no abdominal surgeries.   PAST MEDICAL HISTORY:  1. Significant for hypertension.  2. Chronic headaches.   MEDICATIONS:  Micardis.   ALLERGIES:  HE HAS NO KNOWN DRUG ALLERGIES.   FAMILY HISTORY:  He has a brother who had stomach trouble.  They believe  it may have been ulcers.  He has got no family history of colon cancer.   SOCIAL HISTORY:  He drinks approximately two to three alcoholic drinks  per week.  He stopped smoking approximately 18 years ago.  He is  married.  He is a Naval architect by profession.   PHYSICAL EXAMINATION:  He is alert  and oriented and in no apparent  distress.  CARDIOVASCULAR:  Has a regular rate and rhythm with no murmurs, rubs or  gallops.  LUNGS:  Clear to auscultation bilaterally.  ABDOMEN:  Soft, nontender, nondistended with good bowel sounds.   LABORATORY DATA:  Current labs show a hemoglobin of 8, that is after 2  units of packed red blood cells.  He came in yesterday with a hemoglobin  of 8.6.  White count is 9.0, hematocrit 25.4, platelets are 297.  BUN  yesterday was 37, creatinine was 1.2.  He was guaiac positive in the ER  on March 4, 208.  His TSH is 0.3.   ASSESSMENT:  This is a likely upper GI bleed secondary to NSAID use in  the setting of hypertension.   PLAN:  Upper endoscopy this morning with Dr. Vida Rigger. Thanks very  much for this  consultation.      Stephani Police, PA    ______________________________  Petra Kuba, M.D.    MLY/MEDQ  D:  03/26/2006  T:  03/26/2006  Job:  [id number]   cc:   Kela Millin, M.D.  Petra Kuba, M.D.  Melida Quitter, M.D.

## 2010-06-08 NOTE — Discharge Summary (Signed)
NAMEPETAR, MUCCI                ACCOUNT NO.:  [id number]   MEDICAL RECORD NO.:  [id number]          PATIENT TYPE:  INP   LOCATION:  1528                         FACILITY:  [facility]   PHYSICIAN:  Velora Heckler, MD      DATE OF BIRTH:  [birth year]   DATE OF ADMISSION:  06/23/2006  DATE OF DISCHARGE:  06/28/2006                               DISCHARGE SUMMARY   REASON FOR ADMISSION:  Sigmoid colon carcinoma.   HISTORY OF PRESENT ILLNESS:  [name] is a pleasant 65 year old  white male from [place], [place].  The patient had a  syncopal episode in March of 2008.  He was admitted to the hospital, and  workup revealed anemia.  He subsequently underwent upper endoscopy and  colonoscopy.  Colonoscopy on Jun 02, 2006 demonstrated a circumferential  lesion in the proximal sigmoid colon.  Biopsy showed invasive  adenocarcinoma.  The patient was referred to general surgery for  management.   HOSPITAL COURSE:  The patient was admitted on June 23, 2006.  He was  taken to the operating room where he underwent sigmoid colectomy and  incidental appendectomy.  Postoperative course was straightforward.  The  patient started a clear liquid diet.  His diet was advanced as his ileus  resolved.  He became ambulatory.  Final pathology showed a T3 tumor with  20 lymph nodes negative for metastatic disease.  The patient was seen in  consultation by medical oncology.  The patient made steady progress.  On  June 28, 2006, he was prepared for discharge home.   DISCHARGE PLANNING:  The patient is discharged home on June 28, 2006.   DISCHARGE MEDICATIONS:  Vicodin as needed for pain.   FOLLOW UP:  The patient will be seen back in my office at [facility] in 5 days for wound check and staple removal.   FINAL DIAGNOSIS:  Adenocarcinoma of the sigmoid colon.   CONDITION ON DISCHARGE:  Good.      Velora Heckler, MD  Electronically Signed     TMG/MEDQ  D:  09/01/2006  T:   09/01/2006  Job:  [id number]

## 2010-09-03 ENCOUNTER — Other Ambulatory Visit: Payer: Self-pay | Admitting: Gastroenterology

## 2010-10-24 ENCOUNTER — Other Ambulatory Visit: Payer: Self-pay | Admitting: Internal Medicine

## 2010-10-24 ENCOUNTER — Encounter (HOSPITAL_BASED_OUTPATIENT_CLINIC_OR_DEPARTMENT_OTHER): Payer: Managed Care, Other (non HMO) | Admitting: Internal Medicine

## 2010-10-24 DIAGNOSIS — C187 Malignant neoplasm of sigmoid colon: Secondary | ICD-10-CM

## 2010-10-24 LAB — CBC WITH DIFFERENTIAL/PLATELET
Basophils Absolute: 0.1 10*3/uL (ref 0.0–0.1)
Eosinophils Absolute: 0.1 10*3/uL (ref 0.0–0.5)
HGB: 15.2 g/dL (ref 13.0–17.1)
MONO#: 0.5 10*3/uL (ref 0.1–0.9)
NEUT#: 3.4 10*3/uL (ref 1.5–6.5)
RDW: 14.2 % (ref 11.0–14.6)
lymph#: 1.2 10*3/uL (ref 0.9–3.3)

## 2010-10-24 LAB — COMPREHENSIVE METABOLIC PANEL
AST: 16 U/L (ref 0–37)
Albumin: 3.6 g/dL (ref 3.5–5.2)
BUN: 18 mg/dL (ref 6–23)
CO2: 28 mEq/L (ref 19–32)
Calcium: 9.3 mg/dL (ref 8.4–10.5)
Chloride: 102 mEq/L (ref 96–112)
Glucose, Bld: 90 mg/dL (ref 70–99)
Potassium: 4.1 mEq/L (ref 3.5–5.3)

## 2010-11-05 ENCOUNTER — Encounter (HOSPITAL_BASED_OUTPATIENT_CLINIC_OR_DEPARTMENT_OTHER): Payer: Managed Care, Other (non HMO) | Admitting: Internal Medicine

## 2010-11-05 ENCOUNTER — Other Ambulatory Visit: Payer: Self-pay | Admitting: Internal Medicine

## 2010-11-05 DIAGNOSIS — Z85038 Personal history of other malignant neoplasm of large intestine: Secondary | ICD-10-CM

## 2010-11-05 DIAGNOSIS — C3490 Malignant neoplasm of unspecified part of unspecified bronchus or lung: Secondary | ICD-10-CM

## 2010-11-05 DIAGNOSIS — Z9889 Other specified postprocedural states: Secondary | ICD-10-CM

## 2010-11-05 DIAGNOSIS — C189 Malignant neoplasm of colon, unspecified: Secondary | ICD-10-CM

## 2010-11-08 LAB — BASIC METABOLIC PANEL
BUN: 4 — ABNORMAL LOW
CO2: 28
Calcium: 8.6
Chloride: 107
Creatinine, Ser: 0.96
GFR calc Af Amer: >60

## 2010-11-08 LAB — CEA: CEA: <0.5

## 2010-11-08 LAB — CBC
MCHC: 31.3
MCV: 68.3 — ABNORMAL LOW
Platelets: 259
WBC: 9.7

## 2010-11-08 LAB — TYPE AND SCREEN
ABO/RH(D): O POS
Antibody Screen: NEGATIVE

## 2010-11-08 LAB — ABO/RH: ABO/RH(D): O POS

## 2010-12-26 ENCOUNTER — Telehealth: Payer: Self-pay | Admitting: *Deleted

## 2010-12-26 NOTE — Telephone Encounter (Signed)
left patient voicemessage to inform the patient of the new date and time of her appointments on 04-29-11 at 9:30am lab only 05-07-2011 at 9:45am

## 2011-04-25 ENCOUNTER — Telehealth: Payer: Self-pay | Admitting: Internal Medicine

## 2011-04-25 NOTE — Telephone Encounter (Signed)
called to r/s lab and md visit,will also call [facility] imaging to r/s  aom

## 2011-04-26 ENCOUNTER — Other Ambulatory Visit (HOSPITAL_BASED_OUTPATIENT_CLINIC_OR_DEPARTMENT_OTHER): Payer: Managed Care, Other (non HMO) | Admitting: Lab

## 2011-04-26 ENCOUNTER — Other Ambulatory Visit: Payer: Managed Care, Other (non HMO) | Admitting: Lab

## 2011-04-26 ENCOUNTER — Ambulatory Visit
Admission: RE | Admit: 2011-04-26 | Discharge: 2011-04-26 | Disposition: A | Discharge status: Home / Self Care | Payer: Managed Care, Other (non HMO) | Source: Ambulatory Visit | Attending: Internal Medicine | Admitting: Internal Medicine

## 2011-04-26 DIAGNOSIS — C189 Malignant neoplasm of colon, unspecified: Secondary | ICD-10-CM

## 2011-04-26 DIAGNOSIS — C187 Malignant neoplasm of sigmoid colon: Secondary | ICD-10-CM

## 2011-04-26 LAB — CMP (CANCER CENTER ONLY)
ALT(SGPT): 21 U/L (ref 10–47)
AST: 29 U/L (ref 11–38)
Albumin: 3.6 g/dL (ref 3.3–5.5)
Alkaline Phosphatase: 67 U/L (ref 26–84)
Potassium: 4 mEq/L (ref 3.3–4.7)
Sodium: 139 mEq/L (ref 128–145)
Total Protein: 7.3 g/dL (ref 6.4–8.1)

## 2011-04-26 LAB — CBC WITH DIFFERENTIAL/PLATELET
BASO%: 1 % (ref 0.0–2.0)
EOS%: 4.9 % (ref 0.0–7.0)
MCH: 29.2 pg (ref 27.2–33.4)
MCHC: 34.1 g/dL (ref 32.0–36.0)
MONO#: 0.5 10*3/uL (ref 0.1–0.9)
RDW: 14.2 % (ref 11.0–14.6)
WBC: 5.3 10*3/uL (ref 4.0–10.3)
lymph#: 0.8 10*3/uL — ABNORMAL LOW (ref 0.9–3.3)
nRBC: 0 % (ref 0–0)

## 2011-04-26 MED ORDER — IOHEXOL 300 MG/ML  SOLN
100.0000 mL | Freq: Once | INTRAMUSCULAR | Status: AC | PRN
  Administered 2011-04-26: 100 mL via INTRAVENOUS

## 2011-04-29 ENCOUNTER — Other Ambulatory Visit: Payer: Managed Care, Other (non HMO)

## 2011-04-29 ENCOUNTER — Other Ambulatory Visit: Payer: Managed Care, Other (non HMO) | Admitting: Lab

## 2011-05-06 ENCOUNTER — Telehealth: Payer: Self-pay | Admitting: Internal Medicine

## 2011-05-06 NOTE — Telephone Encounter (Signed)
wife called to ck appty time,advised-aware  aom

## 2011-05-07 ENCOUNTER — Ambulatory Visit: Payer: Managed Care, Other (non HMO) | Admitting: Internal Medicine

## 2011-05-08 ENCOUNTER — Telehealth: Payer: Self-pay | Admitting: Internal Medicine

## 2011-05-08 ENCOUNTER — Ambulatory Visit (HOSPITAL_BASED_OUTPATIENT_CLINIC_OR_DEPARTMENT_OTHER): Payer: Managed Care, Other (non HMO) | Admitting: Internal Medicine

## 2011-05-08 VITALS — BP 122/80 | HR 84 | Temp 97.5°F | Ht 67.0 in | Wt 185.4 lb

## 2011-05-08 DIAGNOSIS — C189 Malignant neoplasm of colon, unspecified: Secondary | ICD-10-CM

## 2011-05-08 DIAGNOSIS — R911 Solitary pulmonary nodule: Secondary | ICD-10-CM

## 2011-05-08 DIAGNOSIS — C187 Malignant neoplasm of sigmoid colon: Secondary | ICD-10-CM

## 2011-05-08 NOTE — Telephone Encounter (Signed)
appts made and printed for pt aom °

## 2011-05-08 NOTE — Progress Notes (Signed)
[facility] Telephone:([phone]   Fax:([phone]) [phone]  OFFICE PROGRESS NOTE  PRINCIPAL DIAGNOSIS:  Stage IIA (T3, N0, MX) colon adenocarcinoma diagnosed in June of 2008.  PRIOR THERAPY:  Status post sigmoid colon resection with dissection of 20 lymph nodes that were negative for malignancy.  This was performed June 23, 2006 under the care of Dr. Gerrit Friends.  CURRENT THERAPY:  Observation.  INTERVAL HISTORY: [name] 66 y.o. male returns to the clinic today for six-month followup visit accompanied by his wife. The patient has no complaints today. He denied having any significant weight loss or night sweats. Has no nausea or vomiting no abdominal pain. No change in his bowel movement. He has been suffering from chest congestion and cold symptoms in the last 3 weeks but he is better today. He has repeat CT scan of the abdomen and pelvis performed recently and he is here today for evaluation and discussion of his lab and the scan results.  MEDICAL HISTORY: Past Medical History  Diagnosis Date  . Hypertension   . colon ca dx'd 05/2006    surg only    ALLERGIES:   has no known allergies.  MEDICATIONS:  Current Outpatient Prescriptions  Medication Sig Dispense Refill  . losartan (COZAAR) 50 MG tablet Take 50 mg by mouth daily.        REVIEW OF SYSTEMS:  A comprehensive review of systems was negative.   PHYSICAL EXAMINATION: General appearance: alert, cooperative and no distress Neck: no adenopathy Lymph nodes: Cervical, supraclavicular, and axillary nodes normal. Resp: clear to auscultation bilaterally Cardio: regular rate and rhythm, S1, S2 normal, no murmur, click, rub or gallop GI: soft, non-tender; bowel sounds normal; no masses,  no organomegaly Extremities: extremities normal, atraumatic, no cyanosis or edema  ECOG PERFORMANCE STATUS: 1 - Symptomatic but completely ambulatory  Blood pressure 122/80, pulse 84, temperature 97.5 F (36.4 C), temperature  source Oral, height 5\' 7"  (1.702 m), weight 185 lb 6.4 oz (84.097 kg).  LABORATORY DATA: Lab Results  Component Value Date   WBC 5.3 04/26/2011   HGB 15.4 04/26/2011   HCT 45.2 04/26/2011   MCV 85.6 04/26/2011   PLT 160 04/26/2011      Chemistry      Component Value Date/Time   NA 139 04/26/2011 0835   NA 138 10/24/2010 1624   NA 138 10/24/2010 1624   K 4.0 04/26/2011 0835   K 4.1 10/24/2010 1624   K 4.1 10/24/2010 1624   CL 101 04/26/2011 0835   CL 102 10/24/2010 1624   CL 102 10/24/2010 1624   CO2 28 04/26/2011 0835   CO2 28 10/24/2010 1624   CO2 28 10/24/2010 1624   BUN 14 04/26/2011 0835   BUN 18 10/24/2010 1624   BUN 18 10/24/2010 1624   CREATININE 1.2 04/26/2011 0835   CREATININE 1.05 10/24/2010 1624   CREATININE 1.05 10/24/2010 1624      Component Value Date/Time   CALCIUM 8.5 04/26/2011 0835   CALCIUM 9.3 10/24/2010 1624   CALCIUM 9.3 10/24/2010 1624   ALKPHOS 67 04/26/2011 0835   ALKPHOS 60 10/24/2010 1624   ALKPHOS 60 10/24/2010 1624   AST 29 04/26/2011 0835   AST 16 10/24/2010 1624   AST 16 10/24/2010 1624   ALT 11 10/24/2010 1624   ALT 11 10/24/2010 1624   BILITOT 0.60 04/26/2011 0835   BILITOT 0.2* 10/24/2010 1624   BILITOT 0.2* 10/24/2010 1624       RADIOGRAPHIC  STUDIES: Ct Abdomen Pelvis W Contrast  04/26/2011  *RADIOLOGY REPORT*  Clinical Data: Follow up colon cancer  CT ABDOMEN AND PELVIS WITH CONTRAST  Technique:  Multidetector CT imaging of the abdomen and pelvis was performed following the standard protocol during bolus administration of intravenous contrast.  Contrast: OMNIPAQUE IOHEXOL 300 MG/ML  SOLN  Comparison: 04/20/2010  Findings: Lung bases shows a tiny nodule incompletely imaged in the right middle lobe measures 4 mm.  I would recommend follow-up CT scan of the chest.  Small hemangioma in the right hepatic lobe medially is stable.  No intrahepatic biliary ductal dilatation.  Pancreas and adrenal glands are unremarkable.  Kidneys are symmetrical in size and enhancement.  Left  renal cysts are stable.  No hydronephrosis or hydroureter.  No small bowel obstruction.  No ascites or free air.  No adenopathy.  Atherosclerotic calcifications of the abdominal aorta and the iliac arteries again noted.  No destructive bony lesions are noted.  Sagittal images of the spine shows stable mild degenerative changes.  Delayed renal images shows bilateral renal symmetrical excretion.  Bilateral visualized proximal ureter is unremarkable.  No calcified gallstones are noted within gallbladder.  Prostate gland and seminal vesicles are unremarkable.  Urinary bladder is unremarkable.  No inguinal adenopathy.  IMPRESSION:  1.  There is a 4 mm nodule incompletely imaged  right middle lobe. Further evaluation with CT scan of the chest is recommended. 2.  No evidence of metastatic or recurrent disease within abdomen or pelvis. 3.  Stable right hepatic lobe hemangioma stable left renal cysts. 4.  No destructive bony lesions are noted.  Stable degenerative changes lumbar spine.  Original Report Authenticated By: Natasha Mead, M.D.    ASSESSMENT: This is a very pleasant 66 years old white male with history of stage IIA adenocarcinoma of the colon, status post sigmoid colon resection. He has been observation since June of 2008. The patient is doing fine and currently asymptomatic but he had questionable 4 mm nodule in the right middle lobe suspicious to be inflammatory in origin secondary to the recent upper respiratory infection.  PLAN: I discussed the scan results with the patient and his wife. I recommended for him to have a CT scan of the chest in 2 months for further evaluation of this pulmonary nodule. The patient was advised to call me immediately if he has any concerning symptoms in the interval.   All questions were answered. The patient knows to call the clinic with any problems, questions or concerns. We can certainly see the patient much sooner if necessary.

## 2011-05-31 ENCOUNTER — Inpatient Hospital Stay (HOSPITAL_COMMUNITY)
Admission: EM | Admit: 2011-05-31 | Discharge: 2011-06-02 | DRG: 312 | Disposition: A | Discharge status: Home / Self Care | Payer: Managed Care, Other (non HMO) | Source: Ambulatory Visit | Attending: Internal Medicine | Admitting: Internal Medicine

## 2011-05-31 ENCOUNTER — Emergency Department (HOSPITAL_COMMUNITY): Payer: Managed Care, Other (non HMO)

## 2011-05-31 ENCOUNTER — Encounter (HOSPITAL_COMMUNITY): Payer: Self-pay | Admitting: *Deleted

## 2011-05-31 DIAGNOSIS — K921 Melena: Secondary | ICD-10-CM | POA: Diagnosis present

## 2011-05-31 DIAGNOSIS — Z8719 Personal history of other diseases of the digestive system: Secondary | ICD-10-CM

## 2011-05-31 DIAGNOSIS — T3995XA Adverse effect of unspecified nonopioid analgesic, antipyretic and antirheumatic, initial encounter: Secondary | ICD-10-CM | POA: Diagnosis present

## 2011-05-31 DIAGNOSIS — K279 Peptic ulcer, site unspecified, unspecified as acute or chronic, without hemorrhage or perforation: Secondary | ICD-10-CM | POA: Diagnosis present

## 2011-05-31 DIAGNOSIS — I10 Essential (primary) hypertension: Secondary | ICD-10-CM | POA: Diagnosis present

## 2011-05-31 DIAGNOSIS — K254 Chronic or unspecified gastric ulcer with hemorrhage: Secondary | ICD-10-CM | POA: Diagnosis present

## 2011-05-31 DIAGNOSIS — Z85038 Personal history of other malignant neoplasm of large intestine: Secondary | ICD-10-CM | POA: Diagnosis present

## 2011-05-31 DIAGNOSIS — D649 Anemia, unspecified: Secondary | ICD-10-CM | POA: Diagnosis present

## 2011-05-31 DIAGNOSIS — Z8711 Personal history of peptic ulcer disease: Secondary | ICD-10-CM

## 2011-05-31 DIAGNOSIS — Z79899 Other long term (current) drug therapy: Secondary | ICD-10-CM

## 2011-05-31 DIAGNOSIS — R55 Syncope and collapse: Principal | ICD-10-CM | POA: Diagnosis present

## 2011-05-31 LAB — DIFFERENTIAL
Eosinophils Absolute: 0.2 10*3/uL (ref 0.0–0.7)
Eosinophils Relative: 2 % (ref 0–5)
Lymphs Abs: 1.1 10*3/uL (ref 0.7–4.0)

## 2011-05-31 LAB — COMPREHENSIVE METABOLIC PANEL
ALT: 10 U/L (ref 0–53)
BUN: 36 mg/dL — ABNORMAL HIGH (ref 6–23)
Calcium: 8.1 mg/dL — ABNORMAL LOW (ref 8.4–10.5)
GFR calc Af Amer: >90 mL/min (ref >90)
Glucose, Bld: 105 mg/dL — ABNORMAL HIGH (ref 70–99)
Sodium: 137 mEq/L (ref 135–145)
Total Protein: 5.7 g/dL — ABNORMAL LOW (ref 6.0–8.3)

## 2011-05-31 LAB — CBC
MCH: 28.1 pg (ref 26.0–34.0)
MCHC: 32.5 g/dL (ref 30.0–36.0)
MCV: 86.7 fL (ref 78.0–100.0)
Platelets: 162 10*3/uL (ref 150–400)
RBC: 4.05 MIL/uL — ABNORMAL LOW (ref 4.22–5.81)

## 2011-05-31 LAB — CK TOTAL AND CKMB (NOT AT ARMC)
Relative Index: 1.9 (ref 0.0–2.5)
Total CK: 126 U/L (ref 7–232)

## 2011-05-31 LAB — TROPONIN I: Troponin I: <0.3 ng/mL (ref <0.30)

## 2011-05-31 MED ORDER — SODIUM CHLORIDE 0.9 % IV SOLN: INTRAVENOUS | Status: DC

## 2011-05-31 NOTE — ED Notes (Signed)
The  pts wife is at the bedside. She says that the pt came home sat down in a chair and asked for food .  When she returned to the room the pt was still sitting down but he was jerking all over and he was out for a few seconds.  She called ems and his color was pale and he was diaphorectic.  He had a similar episode 5years ago when he was diagnosed with colon cancer.  No recent episodes.

## 2011-05-31 NOTE — ED Provider Notes (Cosign Needed Addendum)
History     CSN: [id number]  Arrival date & time 05/31/11  2040   None     Chief Complaint  Patient presents with  . Hypotension    (Consider location/radiation/quality/duration/timing/severity/associated sxs/prior treatment) HPI Comments: The patient is a 66 year old man who says he came home from work and sat down in a chair. He was about the heat, felt as though he was going to black out, had a nauseated feeling. The wife says he put his head back, and stiffened and started shaking. This lasted for a few seconds. EMS was called. They found him to be hypotensive. They gave him intravenous fluids, and transported him to [facility] for evaluation. He did not have similar episodes in the past.  Patient is a 66 y.o. male presenting with syncope.  Loss of Consciousness This is a new problem. The current episode started less than 1 hour ago. Episode frequency: Had brief syncopal episode accompanied by a brief period of shaking. The problem has been resolved. Pertinent negatives include no chest pain, no abdominal pain, no headaches and no shortness of breath. The symptoms are aggravated by nothing. The symptoms are relieved by nothing. Treatments tried: Patient was given intravenous fluids and oxygen by paramedics. The treatment provided significant relief.    Past Medical History  Diagnosis Date  . Hypertension   . colon ca dx'd 05/2006    surg only    History reviewed. No pertinent past surgical history.  No family history on file.  History  Substance Use Topics  . Smoking status: Never Smoker   . Smokeless tobacco: Not on file  . Alcohol Use: No      Review of Systems  Constitutional: Negative.   HENT: Negative.   Eyes: Negative.   Respiratory: Negative.  Negative for shortness of breath.   Cardiovascular: Positive for syncope. Negative for chest pain.       Syncope  Gastrointestinal: Negative for abdominal pain.  Genitourinary: Negative.   Musculoskeletal:  Negative.   Skin: Negative.   Neurological: Positive for seizures and syncope. Negative for headaches.  Psychiatric/Behavioral: Negative.     Allergies  Review of patient's allergies indicates no known allergies.  Home Medications   Current Outpatient Rx  Name Route Sig Dispense Refill  . LOSARTAN POTASSIUM 50 MG PO TABS Oral Take 50 mg by mouth daily.      BP 113/70  Pulse 76  Temp(Src) 98.4 F (36.9 C) (Oral)  Resp 13  SpO2 99%  Physical Exam  Nursing note and vitals reviewed. Constitutional: He is oriented to person, place, and time. He appears well-developed and well-nourished. No distress.  HENT:  Head: Normocephalic and atraumatic.  Right Ear: External ear normal.  Left Ear: External ear normal.  Mouth/Throat: Oropharynx is clear and moist.  Eyes: Conjunctivae are normal. Pupils are equal, round, and reactive to light.  Neck: Normal range of motion. Neck supple.  Cardiovascular: Normal rate, regular rhythm and normal heart sounds.   Pulmonary/Chest: Effort normal and breath sounds normal.  Abdominal: Soft. Bowel sounds are normal.  Musculoskeletal: Normal range of motion. He exhibits no edema and no tenderness.  Neurological: He is alert and oriented to person, place, and time.       No sensory or motor deficit.  Skin: Skin is warm and dry.  Psychiatric: He has a normal mood and affect. His behavior is normal.    ED Course  Procedures (including critical care time)   Labs Reviewed  CBC  DIFFERENTIAL  COMPREHENSIVE METABOLIC PANEL  CK TOTAL AND CKMB  TROPONIN I  SAMPLE TO BLOOD BANK  LIPASE, BLOOD   8:54 PM  Date: 05/31/2011  Rate: 73  Rhythm: normal sinus rhythm  QRS Axis: left  Intervals: normal  ST/T Wave abnormalities: normal  Conduction Disutrbances:none  Narrative Interpretation: Normal EKG.   Old EKG Reviewed: unchanged  9:51 PM Patient was seen and had physical examination. EKG was benign. Lab x-rays and CT of the head were  ordered.   12:10 AM Lab results reviewed with pt and his wife.  His lab tests were essentially normal.  His chemistries showed BUN 36, slightly high, with normal creatinine.  Cardiac markers were negative.  CBC showed mild anemia with Hb 11.4 and Hct 35.1.  Stool was negative for blood.   I advised pt that he had had a syncopal episode and had been hypotensive immediately afterwards.  I recommended overnight observation for syncope.   12:37 AM Case discussed with Dr. Adela Glimpse.  She requests orthostatic vs and a d-dimer.  She will be down to see pt.    1. Syncope             Carleene Cooper III, MD 06/01/11 0015  Carleene Cooper III, MD 06/01/11 [phone]

## 2011-05-31 NOTE — ED Notes (Signed)
The pt arrived by gems from home where he was sitting in a chair after work and felt like he was going to pass out.  When ems arrived the pt had a bp of 70.  Iv was started and a nss fluid bolus given.  No pain anywhere the pt has pale  Color to his skin and he is diaphorectic cool clammy.  He is alert no  distress

## 2011-05-31 NOTE — ED Notes (Signed)
The pt is alert his color has improved from his initial arrival and his skin is warm and dry.  He feels fine now.  1000cc nss added to his  Lt hand iv

## 2011-05-31 NOTE — ED Notes (Signed)
The pt has gone to c-t 

## 2011-06-01 ENCOUNTER — Encounter (HOSPITAL_COMMUNITY): Payer: Self-pay | Admitting: Internal Medicine

## 2011-06-01 DIAGNOSIS — Z85038 Personal history of other malignant neoplasm of large intestine: Secondary | ICD-10-CM | POA: Diagnosis present

## 2011-06-01 DIAGNOSIS — R112 Nausea with vomiting, unspecified: Secondary | ICD-10-CM

## 2011-06-01 DIAGNOSIS — I10 Essential (primary) hypertension: Secondary | ICD-10-CM

## 2011-06-01 DIAGNOSIS — D649 Anemia, unspecified: Secondary | ICD-10-CM | POA: Diagnosis present

## 2011-06-01 DIAGNOSIS — R55 Syncope and collapse: Secondary | ICD-10-CM

## 2011-06-01 LAB — VITAMIN B12: Vitamin B-12: 420 pg/mL (ref 211–911)

## 2011-06-01 LAB — CARDIAC PANEL(CRET KIN+CKTOT+MB+TROPI)
CK, MB: 2.7 ng/mL (ref 0.3–4.0)
Relative Index: INVALID (ref 0.0–2.5)
Total CK: 79 U/L (ref 7–232)

## 2011-06-01 LAB — CBC
HCT: 34 % — ABNORMAL LOW (ref 39.0–52.0)
Hemoglobin: 11.3 g/dL — ABNORMAL LOW (ref 13.0–17.0)
Hemoglobin: 11.5 g/dL — ABNORMAL LOW (ref 13.0–17.0)
MCH: 28.7 pg (ref 26.0–34.0)
MCHC: 33.2 g/dL (ref 30.0–36.0)
MCV: 86.3 fL (ref 78.0–100.0)
Platelets: 164 K/uL (ref 150–400)
RBC: 3.94 MIL/uL — ABNORMAL LOW (ref 4.22–5.81)
RBC: 4.07 MIL/uL — ABNORMAL LOW (ref 4.22–5.81)
RDW: 14.5 % (ref 11.5–15.5)
WBC: 7 K/uL (ref 4.0–10.5)

## 2011-06-01 LAB — COMPREHENSIVE METABOLIC PANEL
ALT: 10 U/L (ref 0–53)
AST: 14 U/L (ref 0–37)
CO2: 20 mEq/L (ref 19–32)
Calcium: 8.2 mg/dL — ABNORMAL LOW (ref 8.4–10.5)
Sodium: 137 mEq/L (ref 135–145)
Total Protein: 6 g/dL (ref 6.0–8.3)

## 2011-06-01 LAB — FOLATE: Folate: 15.1 ng/mL

## 2011-06-01 LAB — IRON AND TIBC: Saturation Ratios: 18 % — ABNORMAL LOW (ref 20–55)

## 2011-06-01 LAB — MAGNESIUM: Magnesium: 1.9 mg/dL (ref 1.5–2.5)

## 2011-06-01 LAB — RETICULOCYTES
RBC.: 4.07 MIL/uL — ABNORMAL LOW (ref 4.22–5.81)
Retic Count, Absolute: 65.1 10*3/uL (ref 19.0–186.0)

## 2011-06-01 LAB — TSH: TSH: 0.597 u[IU]/mL (ref 0.350–4.500)

## 2011-06-01 MED ORDER — PANTOPRAZOLE SODIUM 40 MG PO TBEC
40.0000 mg | DELAYED_RELEASE_TABLET | Freq: Every day | ORAL | Status: DC
  Administered 2011-06-01 – 2011-06-02 (×2): 40 mg via ORAL
  Filled 2011-06-01 (×3): qty 1

## 2011-06-01 MED ORDER — HYDROCODONE-ACETAMINOPHEN 5-325 MG PO TABS
1.0000 | ORAL_TABLET | ORAL | Status: DC | PRN
  Administered 2011-06-02: 1 via ORAL
  Filled 2011-06-01: qty 1

## 2011-06-01 MED ORDER — ONDANSETRON HCL 4 MG PO TABS: 4.0000 mg | ORAL_TABLET | Freq: Four times a day (QID) | ORAL | Status: DC | PRN

## 2011-06-01 MED ORDER — SODIUM CHLORIDE 0.9 % IV SOLN: INTRAVENOUS | Status: DC

## 2011-06-01 MED ORDER — SODIUM CHLORIDE 0.9 % IV SOLN
INTRAVENOUS | Status: DC
  Administered 2011-06-01: 04:00:00 via INTRAVENOUS

## 2011-06-01 MED ORDER — ALUM & MAG HYDROXIDE-SIMETH 200-200-20 MG/5ML PO SUSP: 30.0000 mL | Freq: Four times a day (QID) | ORAL | Status: DC | PRN

## 2011-06-01 MED ORDER — ACETAMINOPHEN 650 MG RE SUPP: 650.0000 mg | Freq: Four times a day (QID) | RECTAL | Status: DC | PRN

## 2011-06-01 MED ORDER — GUAIFENESIN-DM 100-10 MG/5ML PO SYRP: 5.0000 mL | ORAL_SOLUTION | ORAL | Status: DC | PRN

## 2011-06-01 MED ORDER — ENOXAPARIN SODIUM 40 MG/0.4ML ~~LOC~~ SOLN
40.0000 mg | SUBCUTANEOUS | Status: DC
  Filled 2011-06-01: qty 0.4

## 2011-06-01 MED ORDER — OMEPRAZOLE MAGNESIUM 20 MG PO TBEC: 20.0000 mg | DELAYED_RELEASE_TABLET | Freq: Every day | ORAL | Status: DC

## 2011-06-01 MED ORDER — SODIUM CHLORIDE 0.9 % IJ SOLN
3.0000 mL | Freq: Two times a day (BID) | INTRAMUSCULAR | Status: DC
  Administered 2011-06-01 – 2011-06-02 (×4): 3 mL via INTRAVENOUS

## 2011-06-01 MED ORDER — DOCUSATE SODIUM 100 MG PO CAPS
100.0000 mg | ORAL_CAPSULE | Freq: Two times a day (BID) | ORAL | Status: DC
  Administered 2011-06-01 – 2011-06-02 (×3): 100 mg via ORAL
  Filled 2011-06-01 (×4): qty 1

## 2011-06-01 MED ORDER — ONDANSETRON HCL 4 MG/2ML IJ SOLN: 4.0000 mg | Freq: Four times a day (QID) | INTRAMUSCULAR | Status: DC | PRN

## 2011-06-01 MED ORDER — ACETAMINOPHEN 325 MG PO TABS: 650.0000 mg | ORAL_TABLET | Freq: Four times a day (QID) | ORAL | Status: DC | PRN

## 2011-06-01 MED ORDER — ALBUTEROL SULFATE (5 MG/ML) 0.5% IN NEBU: 2.5000 mg | INHALATION_SOLUTION | RESPIRATORY_TRACT | Status: DC | PRN

## 2011-06-01 MED ORDER — ASPIRIN EC 81 MG PO TBEC
81.0000 mg | DELAYED_RELEASE_TABLET | Freq: Every day | ORAL | Status: DC
  Administered 2011-06-01: 81 mg via ORAL
  Filled 2011-06-01: qty 1

## 2011-06-01 NOTE — Discharge Summary (Signed)
Physician Discharge Summary  [name] WUJ:[id number] DOB: 1945-09-14 DOA: 05/31/2011  PCP: Johny Blamer, MD, MD  Admit date: 05/31/2011 Discharge date: 06/02/2011 Active Problems:  Syncope  Anemia  Hx of colon cancer, stage III  Melena  Hx of gastric ulcer  PUD (peptic ulcer disease)  ISSUES for follow up  Needs f/u CBC at next visit  Discharge Diagnoses:  1. Syncope -  2. PUD without stigmata of bleeding 3. Anemia  4. Hx  Of colon cancer  Discharge Condition: good  History of present illness:  66 yo man with hx of PUD, colon cancer admitted after an episode of syncope.  Hospital Course:  1. Syncope: - patient presented with an episode of brief loss of consciousness. Was admitted on Telemetry and he remained stable without arrhythmias. His echocardiogram did not indicate any valvular abnormalities. EF was within normal limits. Dr. Elease Hashimoto from Cardiology saw the patient and did not recommend further cardiac w/u. There were no clinical concerns for stroke or TIA.  2. Anemia - with slight Hb droop from April 2013. Patient also had 1 melanotic stool. EGD revealed PUD related to nsaids without stigmata of active bleeding. Patient was started on PPI and he will f/u with Dr. Ewing Schlein. It is unclear if the PUD and anemia is the cause of syncope   Discharge Exam: Filed Vitals:   06/02/11 1400  BP: 119/72  Pulse: 73  Temp: 98 F (36.7 C)  Resp: 20   Filed Vitals:   06/02/11 1110 06/02/11 1120 06/02/11 1130 06/02/11 1400  BP: 94/49 114/62 136/60 119/72  Pulse:    73  Temp:    98 F (36.7 C)  TempSrc:    Oral  Resp: 13 9 10 20   Height:      Weight:      SpO2: 99% 99% 100% 99%   General: alert and oriented x3 Cardiovascular: RRR, nl S1, S2, no S3 Respiratory: CTAB  Discharge Instructions  Discharge Orders    Future Appointments: Provider: Department: Dept Phone: Center:   06/25/2011 8:30 AM [facility] [address] [facility] [phone] [facility] [address] [facility]   06/26/2011 9:00 AM  Si Gaul, MD [facility] [phone] None     Future Orders Please Complete By Expires   Diet general      Increase activity slowly        Medication List  As of 06/02/2011  5:27 PM   STOP taking these medications         losartan 50 MG tablet         TAKE these medications         omeprazole 20 MG tablet   Commonly known as: PRILOSEC OTC   Take 2 tablets (40 mg total) by mouth daily.           Follow-up Information    Schedule an appointment as soon as possible for a visit with Johny Blamer, MD.   Contact information:   [facility] [address] [place] [address] [phone]       Schedule an appointment as soon as possible for a visit with [facility] E, MD.   Contact information:   [address], [address] [employer], [place]. [place] [address] [phone]           The results of significant diagnostics from this hospitalization (including imaging, microbiology, ancillary and laboratory) are listed below for reference.   2D echo Study Conclusions  Left ventricle: The cavity size was normal. Wall thickness was normal. Systolic function was normal. The estimated ejection fraction was in the range of 60% to 65%.  EGD  EGD showed one moderate sized duodenal and one small antral ulcer with several antral erosions consistent with NSAID use. There is no stigmata of hemorrhage. There is a CLOtest pending. Dr. Dorena Cookey   Significant Diagnostic Studies: Dg Chest 2 View  05/31/2011  *RADIOLOGY REPORT*  Clinical Data: Syncope.  CHEST - 2 VIEW  Comparison: PA and lateral chest 04/20/2010.  Findings: Lungs are clear.  The heart size is normal.  No pneumothorax or pleural fluid.  No focal bony abnormality.  IMPRESSION: Negative chest.  Original Report Authenticated By: Bernadene Bell. Maricela Curet, M.D.   Ct Head Wo Contrast  05/31/2011  *RADIOLOGY REPORT*  Clinical Data:  Hypotension.  Near-syncope.  CT HEAD WITHOUT CONTRAST  Technique:  Contiguous axial images were obtained from the base of the skull through the vertex without contrast.  Comparison: Head CT scan 03/24/2006.  Findings: The brain appears normal without evidence of infarction, hemorrhage, mass lesion, mass effect, midline shift or abnormal extra-axial fluid collection.  No hydrocephalus or pneumocephalus. Mucosal thickening right maxillary sinus is noted.  IMPRESSION: No acute intracranial abnormality.  Original Report Authenticated By: Bernadene Bell. Maricela Curet, M.D.    Microbiology: No results found for this or any previous visit (from the past 240 hour(s)).   Labs: Basic Metabolic Panel:  Lab 06/01/11 9811 05/31/11 2045  NA 137 137  K 4.1 4.3  CL 106 107  CO2 20 22  GLUCOSE 80 105*  BUN 34* 36*  CREATININE 0.80 0.93  CALCIUM 8.2* 8.1*  MG 1.9 --  PHOS 2.8 --   Liver Function Tests:  Lab 06/01/11 0540 05/31/11 2045  AST 14 14  ALT 10 10  ALKPHOS 42 44  BILITOT 0.4 0.3  PROT 6.0 5.7*  ALBUMIN 3.0* 2.8*    Lab 05/31/11 2045  LIPASE 22  AMYLASE --   No results found for this basename: AMMONIA:5 in the last 168 hours CBC:  Lab 06/02/11 0629 06/01/11 1615 06/01/11 0540 05/31/11 2045  WBC 6.0 5.9 7.0 10.3  NEUTROABS -- -- -- 8.4*  HGB 11.5* 11.5* 11.3* 11.4*  HCT 35.1* 35.2* 34.0* 35.1*  MCV 86.7 86.5 86.3 86.7  PLT 174 180 164 162   Cardiac Enzymes:  Lab 06/01/11 1125 06/01/11 0540 05/31/11 2045  CKTOTAL 79 95 126  CKMB 2.7 2.6 2.4  CKMBINDEX -- -- --  TROPONINI <0.30 <0.30 <0.30   BNP: No components found with this basename: POCBNP:5 CBG: No results found for this basename: GLUCAP:5 in the last 168 hours  Time coordinating discharge: 40 minutes  Signed:  Lonia Blood, MD  [facility] 06/02/2011, 5:27 PM

## 2011-06-01 NOTE — Progress Notes (Signed)
Spoke with Dr. Adela Glimpse who stated we will not be ruling the pt out for stroke/tia at this time. Will cont to monitor pt.

## 2011-06-01 NOTE — H&P (Signed)
PCP:  Johny Blamer   Chief Complaint:   Syncope  HPI: [name] is a 66 y.o. male   has a past medical history of Hypertension and colon ca (dx'd 05/2006).   Presented with  Episode of shaking, felt like he was about to pass out, associated with nausea, and cold sweat. Patient can not at remember the event but as per his wife his Arms and legs got stiff. NO chest pain no SOB. EMS came in and found him hypotensive his family is unsure what the blood pressure he was. Once he got fluids he felt better. Now back to baseline. No leg swelling. Drives about 12 hours a day. No worsening PO intake.   Review of Systems:    Pertinent positives include:  Constitutional:  No weight loss, night sweats, Fevers, chills, fatigue, weight loss  HEENT:  No headaches, Difficulty swallowing,Tooth/dental problems,Sore throat,  No sneezing, itching, ear ache, nasal congestion, post nasal drip,  Cardio-vascular:  No chest pain, Orthopnea, PND, anasarca, dizziness, palpitations.no Bilateral lower extremity swelling  GI:  No heartburn, indigestion, abdominal pain, nausea, vomiting, diarrhea, change in bowel habits, loss of appetite, melena, blood in stool, hematemesis Resp:  no shortness of breath at rest. No dyspnea on exertion, No excess mucus, no productive cough, No non-productive cough, No coughing up of blood.No change in color of mucus.No wheezing. Skin:  no rash or lesions. No jaundice GU:  no dysuria, change in color of urine, no urgency or frequency. No straining to urinate.  No flank pain.  Musculoskeletal:  No joint pain or no joint swelling. No decreased range of motion. No back pain.  Psych:  No change in mood or affect. No depression or anxiety. No memory loss.  Neuro: no localizing neurological complaints, no tingling, no weakness, no double vision, no gait abnormality, no slurred speech, no confusion  Otherwise ROS are negative except for above, 10 systems were reviewed  Past  Medical History: Past Medical History  Diagnosis Date  . Hypertension   . colon ca dx'd 05/2006    surg only   History reviewed. No pertinent past surgical history.   Medications: Prior to Admission medications   Medication Sig Start Date End Date Taking? Authorizing Provider  losartan (COZAAR) 50 MG tablet Take 50 mg by mouth daily.   Yes Historical Provider, MD    Allergies:  No Known Allergies  Social History:  Ambulatory independently  Lives at  home   reports that he has quit smoking. He does not have any smokeless tobacco history on file. He reports that he does not drink alcohol or use illicit drugs.   Family History: family history includes Heart disease in his father and mother; Lung cancer in his mother; and Prostate cancer in his father.    Physical Exam: Patient Vitals for the past 24 hrs:  BP Temp Temp src Pulse Resp SpO2  05/31/11 2244 - 98.7 F (37.1 C) - - - -  05/31/11 2243 119/78 mmHg - - 72  18  100 %  05/31/11 2216 117/78 mmHg - - 77  12  99 %  05/31/11 2124 103/81 mmHg - - 70  18  99 %  05/31/11 2042 113/70 mmHg 98.4 F (36.9 C) Oral 76  13  99 %    1. General:  in No Acute distress 2. Psychological: Alert and  Oriented 3. Head/ENT:   Moist  Mucous Membranes  Head Non traumatic, neck supple                          Normal  Dentition 4. SKIN:  decreased Skin turgor,  Skin clean Dry and intact no rash 5. Heart: Regular rate and rhythm no Murmur, Rub or gallop 6. Lungs: Clear to auscultation bilaterally, no wheezes or crackles   7. Abdomen: Soft, non-tender, Non distended 8. Lower extremities: no clubbing, cyanosis, or edema 9. Neurologically Grossly intact, moving all 4 extremities equally 10. MSK: Normal range of motion  body mass index is unknown because there is no height or weight on file.   Labs on Admission:   [facility] 05/31/11 2045  NA 137  K 4.3  CL 107  CO2 22  GLUCOSE 105*  BUN 36*  CREATININE 0.93    CALCIUM 8.1*  MG --  PHOS --    Basename 05/31/11 2045  AST 14  ALT 10  ALKPHOS 44  BILITOT 0.3  PROT 5.7*  ALBUMIN 2.8*    Basename 05/31/11 2045  LIPASE 22  AMYLASE --    Basename 05/31/11 2045  WBC 10.3  NEUTROABS 8.4*  HGB 11.4*  HCT 35.1*  MCV 86.7  PLT 162    Basename 05/31/11 2045  CKTOTAL 126  CKMB 2.4  CKMBINDEX --  TROPONINI <0.30   No results found for this basename: TSH,T4TOTAL,FREET3,T3FREE,THYROIDAB in the last 72 hours No results found for this basename: VITAMINB12:2,FOLATE:2,FERRITIN:2,TIBC:2,IRON:2,RETICCTPCT:2 in the last 72 hours No results found for this basename: HGBA1C    The CrCl is unknown because both a height and weight (above a minimum accepted value) are required for this calculation. ABG No results found for this basename: phart, pco2, po2, hco3, tco2, acidbasedef, o2sat     Lab Results  Component Value Date   DDIMER <0.22 06/01/2011     Other results:  I have pearsonaly reviewed this: ECG REPORT  Rate: 74  Rhythm: Sinus rhythm ST&T Change: No ischemic changes    Cultures: No results found for this basename: sdes, specrequest, cult, reptstatus       Radiological Exams on Admission: Dg Chest 2 View  05/31/2011  *RADIOLOGY REPORT*  Clinical Data: Syncope.  CHEST - 2 VIEW  Comparison: PA and lateral chest 04/20/2010.  Findings: Lungs are clear.  The heart size is normal.  No pneumothorax or pleural fluid.  No focal bony abnormality.  IMPRESSION: Negative chest.  Original Report Authenticated By: Bernadene Bell. Maricela Curet, M.D.   Ct Head Wo Contrast  05/31/2011  *RADIOLOGY REPORT*  Clinical Data: Hypotension.  Near-syncope.  CT HEAD WITHOUT CONTRAST  Technique:  Contiguous axial images were obtained from the base of the skull through the vertex without contrast.  Comparison: Head CT scan 03/24/2006.  Findings: The brain appears normal without evidence of infarction, hemorrhage, mass lesion, mass effect, midline shift or abnormal  extra-axial fluid collection.  No hydrocephalus or pneumocephalus. Mucosal thickening right maxillary sinus is noted.  IMPRESSION: No acute intracranial abnormality.  Original Report Authenticated By: Bernadene Bell. Maricela Curet, M.D.    Assessment/Plan This is a 66 year old gentleman in episode of syncope which is  worrisome for cardiac syncope  Present on Admission:  .Syncope - admitted for syncope workup will obtain echo gram of the heart, carotid Dopplers, Will monitor on telemetry. Cycle cardiac enzymes and check EKG  .Anemia this appears to be chronic  Prophylaxis:  Lovenox,  CODE STATUS: Full code I have spent a total of on  this admission  Emree Locicero 06/01/2011, 1:13 AM  And

## 2011-06-01 NOTE — Progress Notes (Signed)
[facility] PROGRESS NOTE  [name] KKX:[id number] DOB: [birth year] DOA: 05/31/2011 PCP: Johny Blamer, MD, MD  Active Problems:  Syncope  Anemia  Hx of colon cancer, stage III     Assessment/Plan: 1. Syncope - unclear cause . So far no arrythmia observed on the monitor, asked cardiology to see. await echocardiogram. Maybe dehydration.  2. Anemia - very mild. Doubt the cause of syncope  Code Status: full Family Communication: wife Disposition Plan: home  Lonia Blood,   [facility] Pager [phone]  If 7PM-7AM, please contact night-coverage [email or web address] Password [email or web address] 06/01/2011, 11:06 AM   LOS: 1 day     Consultants:  [facility]    Subjective:   Objective: Filed Vitals:   06/01/11 0500 06/01/11 0501 06/01/11 0502 06/01/11 0800  BP: 134/86 116/75 119/78 114/83  Pulse: 86 86 102 74  Temp: 97 F (36.1 C)   98.2 F (36.8 C)  TempSrc: Oral   Oral  Resp: 20   20  SpO2:    98%    Intake/Output Summary (Last 24 hours) at 06/01/11 1106 Last data filed at 06/01/11 0900  Gross per 24 hour  Intake    120 ml  Output    701 ml  Net   -581 ml    Exam:  Alert and oriented x3 CVS: RRR Rs: CTAB Abdomen : soft, NT Neuro - intact   Data Reviewed: Basic Metabolic Panel:  Lab 06/01/11 3810 05/31/11 2045  NA 137 137  K 4.1 4.3  CL 106 107  CO2 20 22  GLUCOSE 80 105*  BUN 34* 36*  CREATININE 0.80 0.93  CALCIUM 8.2* 8.1*  MG 1.9 --  PHOS 2.8 --   Liver Function Tests:  Lab 06/01/11 0540 05/31/11 2045  AST 14 14  ALT 10 10  ALKPHOS 42 44  BILITOT 0.4 0.3  PROT 6.0 5.7*  ALBUMIN 3.0* 2.8*    Lab 05/31/11 2045  LIPASE 22  AMYLASE --   No results found for this basename: AMMONIA:5 in the last 168 hours CBC:  Lab 06/01/11 0540 05/31/11 2045  WBC 7.0 10.3  NEUTROABS -- 8.4*  HGB 11.3* 11.4*  HCT 34.0* 35.1*  MCV 86.3 86.7  PLT 164 162   Cardiac Enzymes:  Lab 06/01/11 0540 05/31/11 2045    CKTOTAL 95 126  CKMB 2.6 2.4  CKMBINDEX -- --  TROPONINI <0.30 <0.30       Studies: Dg Chest 2 View  05/31/2011  *RADIOLOGY REPORT*  Clinical Data: Syncope.  CHEST - 2 VIEW  Comparison: PA and lateral chest 04/20/2010.  Findings: Lungs are clear.  The heart size is normal.  No pneumothorax or pleural fluid.  No focal bony abnormality.  IMPRESSION: Negative chest.  Original Report Authenticated By: Bernadene Bell. Maricela Curet, M.D.   Ct Head Wo Contrast  05/31/2011  *RADIOLOGY REPORT*  Clinical Data: Hypotension.  Near-syncope.  CT HEAD WITHOUT CONTRAST  Technique:  Contiguous axial images were obtained from the base of the skull through the vertex without contrast.  Comparison: Head CT scan 03/24/2006.  Findings: The brain appears normal without evidence of infarction, hemorrhage, mass lesion, mass effect, midline shift or abnormal extra-axial fluid collection.  No hydrocephalus or pneumocephalus. Mucosal thickening right maxillary sinus is noted.  IMPRESSION: No acute intracranial abnormality.  Original Report Authenticated By: Bernadene Bell. D'ALESSIO, M.D.    Scheduled Meds:    . aspirin EC  81 mg Oral Daily  . docusate sodium  100 mg Oral BID  .  enoxaparin  40 mg Subcutaneous Q24H  . sodium chloride  3 mL Intravenous Q12H  . DISCONTD: sodium chloride   Intravenous STAT  . DISCONTD: sodium chloride   Intravenous STAT   Continuous Infusions:    . DISCONTD: sodium chloride    . DISCONTD: sodium chloride 75 mL/hr at 06/01/11 0334

## 2011-06-01 NOTE — Consult Note (Signed)
CARDIOLOGY CONSULT NOTE  Patient ID: [name] MRN: [id number] DOB/AGE: [birth year] 64 y.o.  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 He was drinking a beer waiting for his wife to bring him dinner. He states he suddenly  felt very tired and had sudden "wave of flushing" with pre-syncope. Began to have diaphoresis.. Wife states he threw his head back and began to shake and stiffen.He did not lose control of bowel or bladder. Speech was clear, no slurring.  Episode lasted about 5 seconds according to his wife. He was talking to her and able to follow commands.  EMS was called and, per ER note, he was hypotensive. He was given IV fluid hydration and transported to ER. On arrival to ER, BP was 113/70. HR 76 NSR. Head CT was normal. CXR was normal.     He has a history of syncope with admission in 2008. At that time he was found to be orthostatic and anemic. He had EGD and colonoscopy secondary to melena during that admission,on Goody Powders. Dr. Kathy Breach attending . There was evidence of shallow ulcers noted and multiple antral erosions. He was found to have proximal sigmoid colon carcinoma per pathology after a lesion was found and biopsied.. He had subsequent sigmoid colectomy and incidental appendectomy. He is followed by Dr. Gwenyth Bouillon, oncologist with planned CT scans next month. As far as he knows he is cancer free.    He was seen by [facility] cardiology in 2006 for complaints of severe leg cramps with stress test, echo and doppler ultrasound of lower extremities. He states all tests were normal and he did not have to follow-up with cardiology after that. He has a history of hypertension and is on  cozaar. Echo is pending. Carotid studies are pending.   Cardiac enzymes are found to be negative, he was anemic with Hgb of 11.4  compared to Hgb of 15.4 in April of 2013. , electrolytes are WNL with the exception of elevated BUN and low calcium. Liver enzymes were WNL with elevation in April comparison labs. He denies chest pain, shortness of breath or melena. No abdominal pain or cramping.   Review of systems complete and found to be negative unless listed above   Past Medical History  Diagnosis Date  . Hypertension   . colon ca dx'd 05/2006    surg only    Family History  Problem Relation Age of Onset  . Heart disease Mother   . Lung cancer Mother   . Heart disease Father   . Prostate cancer Father     History   Social History  . Marital Status: Married    Spouse Name: N/A    Number of Children: N/A  . Years of Education: N/A   Occupational History  . Not on file.   Social History Main Topics  . Smoking status: Former Games developer  . Smokeless tobacco: Not on file  . Alcohol Use: No  . Drug Use: No  . Sexually Active:    Other Topics Concern  . Not on file   Social History Narrative  . No narrative on file    History reviewed. No pertinent past surgical history.   Prescriptions prior to admission  Medication Sig  Dispense Refill  . losartan (COZAAR) 50 MG tablet Take 50 mg by mouth daily.        Physical Exam: Blood pressure 122/63, pulse 80, temperature 97.6 F (36.4 C), temperature source Oral, resp. rate 20, SpO2 97.00%.   General: Well developed, well nourished, in no acute distress Head: Eyes PERRLA, No xanthomas.   Normal cephalic and atramatic  Lungs: Clear bilaterally to auscultation and percussion. Heart: HRRR S1 S2, without MRG.  Pulses are 2+ & equal.            No carotid bruit. No JVD.  No abdominal bruits. No femoral bruits. Abdomen: Bowel sounds are positive, abdomen soft and non-tender without masses or                  Hernia's noted. Msk:  Back  normal, normal gait. Normal strength and tone for age. Extremities: No clubbing, cyanosis or edema.  DP +1 Neuro: Alert and oriented X 3. Psych:  Good affect, responds appropriately  Labs:   Lab Results  Component Value Date   WBC 7.0 06/01/2011   HGB 11.3* 06/01/2011   HCT 34.0* 06/01/2011   MCV 86.3 06/01/2011   PLT 164 06/01/2011    Lab 06/01/11 0540  NA 137  K 4.1  CL 106  CO2 20  BUN 34*  CREATININE 0.80  CALCIUM 8.2*  PROT 6.0  BILITOT 0.4  ALKPHOS 42  ALT 10  AST 14  GLUCOSE 80   Lab Results  Component Value Date   CKTOTAL 79 06/01/2011   CKMB 2.7 06/01/2011   TROPONINI <0.30 06/01/2011        Radiology: Dg Chest 2 View  05/31/2011  *RADIOLOGY REPORT*  Clinical Data: Syncope.  CHEST - 2 VIEW  Comparison: PA and lateral chest 04/20/2010.  Findings: Lungs are clear.  The heart size is normal.  No pneumothorax or pleural fluid.  No focal bony abnormality.  IMPRESSION: Negative chest.  Original Report Authenticated By: Bernadene Bell. Maricela Curet, M.D.   Ct Head Wo Contrast  05/31/2011  *RADIOLOGY REPORT*  Clinical Data: Hypotension.  Near-syncope.  CT HEAD WITHOUT CONTRAST  Technique:  Contiguous axial images were obtained from the base of the skull through the vertex without contrast.  Comparison: Head CT scan 03/24/2006.  Findings: The brain appears normal without evidence of infarction, hemorrhage, mass lesion, mass effect, midline shift or abnormal extra-axial fluid collection.  No hydrocephalus or pneumocephalus. Mucosal thickening right maxillary sinus is noted.  IMPRESSION: No acute intracranial abnormality.  Original Report Authenticated By: Bernadene Bell. D'ALESSIO, M.D.   EKG:NSR no evidence of pre-excitation abnormalities  ASSESSMENT AND PLAN:   1. Syncopal episode: Uncertain etiology, no evidence of seizure activity or CVA. He was found to be hypotensive by EMS and hydrated with immediate improvement and no further complaints. Carotid studies are pending along with Echo.   He is noted to be anemic compared to April labs with 4 gm drop in one month. This could be a contributing factor. More studies are recommended. There is no evidence of arrythmia on telemetry or complaints of racing or pounding HR prior episode.  2. Anemia: Comparison to April 2013 labs, Hgb had dropped from 15.4 to 11.4 without complaint of melena or hemepotysis. He is followed by Dr. Shirline Frees, oncologist. Will have anemia profile drawn. Hemoccult stools. Avoid Goody Powders.   3. Hypertension: On Cozaar 50 mg at home. No other medications for BP control. This is on hold at present.  Signed: Bettey Mare. Lyman Bishop  NP [facility] 06/01/2011, 1:38 PM Co-Sign MD   Attending Note:   The patient was seen and examined.  Agree with assessment and plan as noted above.  Very pleasant gentleman.  See history above.  I suspect he had a brief episode of hypotension for unknown reasons.  No heart block. Echo is unremarkable.  His HB fell from 15 to 11 in a month.  Stool is heme negative as of yesterday.  We have ordered another CBC.  An acute blood loss that results in a Hb dropping from 15 to  11 would cause syncope.  No obvious source of bleeding found.    OK to go home from cardiology standpoint.  He'll need close follow up with his medical doctor - especially if his Hb is lower.  Vesta Mixer, Montez Hageman., MD, [facility] 06/01/2011, 2:56 PM

## 2011-06-01 NOTE — Progress Notes (Signed)
  Echocardiogram 2D Echocardiogram has been performed.  [name] 06/01/2011, 11:55 AM

## 2011-06-02 ENCOUNTER — Encounter (HOSPITAL_COMMUNITY): Payer: Self-pay

## 2011-06-02 ENCOUNTER — Encounter (HOSPITAL_COMMUNITY)
Admission: EM | Disposition: A | Discharge status: Home / Self Care | Payer: Self-pay | Source: Ambulatory Visit | Attending: Internal Medicine

## 2011-06-02 DIAGNOSIS — I10 Essential (primary) hypertension: Secondary | ICD-10-CM

## 2011-06-02 DIAGNOSIS — Z8719 Personal history of other diseases of the digestive system: Secondary | ICD-10-CM

## 2011-06-02 DIAGNOSIS — K921 Melena: Secondary | ICD-10-CM | POA: Diagnosis present

## 2011-06-02 DIAGNOSIS — K250 Acute gastric ulcer with hemorrhage: Secondary | ICD-10-CM

## 2011-06-02 DIAGNOSIS — R112 Nausea with vomiting, unspecified: Secondary | ICD-10-CM

## 2011-06-02 DIAGNOSIS — Z8711 Personal history of peptic ulcer disease: Secondary | ICD-10-CM

## 2011-06-02 DIAGNOSIS — K279 Peptic ulcer, site unspecified, unspecified as acute or chronic, without hemorrhage or perforation: Secondary | ICD-10-CM | POA: Diagnosis present

## 2011-06-02 DIAGNOSIS — R55 Syncope and collapse: Secondary | ICD-10-CM

## 2011-06-02 HISTORY — PX: ESOPHAGOGASTRODUODENOSCOPY: SHX5428

## 2011-06-02 LAB — CBC
HCT: 35.1 % — ABNORMAL LOW (ref 39.0–52.0)
Hemoglobin: 11.5 g/dL — ABNORMAL LOW (ref 13.0–17.0)
MCV: 86.7 fL (ref 78.0–100.0)
RBC: 4.05 MIL/uL — ABNORMAL LOW (ref 4.22–5.81)
WBC: 6 10*3/uL (ref 4.0–10.5)

## 2011-06-02 SURGERY — EGD (ESOPHAGOGASTRODUODENOSCOPY)
Anesthesia: Moderate Sedation

## 2011-06-02 MED ORDER — MIDAZOLAM HCL 10 MG/2ML IJ SOLN
INTRAMUSCULAR | Status: DC | PRN
  Administered 2011-06-02 (×2): 2.5 mg via INTRAVENOUS

## 2011-06-02 MED ORDER — FENTANYL NICU IV SYRINGE 50 MCG/ML
INJECTION | INTRAMUSCULAR | Status: DC | PRN
  Administered 2011-06-02 (×2): 25 ug via INTRAVENOUS

## 2011-06-02 MED ORDER — MIDAZOLAM HCL 10 MG/2ML IJ SOLN
INTRAMUSCULAR | Status: AC
  Filled 2011-06-02: qty 2

## 2011-06-02 MED ORDER — OMEPRAZOLE MAGNESIUM 20 MG PO TBEC: 40.0000 mg | DELAYED_RELEASE_TABLET | Freq: Every day | ORAL | Status: DC

## 2011-06-02 MED ORDER — SODIUM CHLORIDE 0.9 % IV SOLN
INTRAVENOUS | Status: DC
  Administered 2011-06-02: 500 mL via INTRAVENOUS

## 2011-06-02 MED ORDER — FENTANYL CITRATE 0.05 MG/ML IJ SOLN
INTRAMUSCULAR | Status: AC
  Filled 2011-06-02: qty 2

## 2011-06-02 MED ORDER — BUTAMBEN-TETRACAINE-BENZOCAINE 2-2-14 % EX AERO
INHALATION_SPRAY | CUTANEOUS | Status: DC | PRN
  Administered 2011-06-02: 2 via TOPICAL

## 2011-06-02 NOTE — Op Note (Signed)
[facility] [address] [place], [place]  [address]  ENDOSCOPY PROCEDURE REPORT  PATIENT:  Jeffrey, [name]  MR#:  [id number] BIRTHDATE:  1945-07-16, 65 yrs. old  GENDER:  male  ENDOSCOPIST:  Dorena Cookey Referred by:  PROCEDURE DATE:  06/02/2011 PROCEDURE: ASA CLASS: INDICATIONS:  MEDICATIONS: Fentanyl 50 mcg, Versed 4 mg TOPICAL ANESTHETIC: Cetacaine spray  DESCRIPTION OF PROCEDURE:   After the risks and benefits of the procedure were explained, informed consent was obtained.  The Pentax Gastroscope [id number] endoscope was introduced through the mouth and advanced to the .  The instrument was slowly withdrawn as the mucosa was fully examined. <<PROCEDUREIMAGES>> 1 shallow prepyloric gastric ulcer with one 5 mm deeper duodenal distal bulb ulcer with no stigmata of hemorrhage and no active bleeding. Multiple scattered small erosions without exudate in the antrum. CLOtest was obtained. COMPLICATIONS:  None  ENDOSCOPIC IMPRESSION: Moderate sized duodenal and small gastric ulcers with no stigmata of hemorrhage  RECOMMENDATIONS: Proton pump inhibitor therapy, cessation of nonsteroidal anti-inflammatory drugs, and await CLOtest to determine Helicobacter status.  ______________________________ Dorena Cookey  CC:  n. eSIGNEDDorena Cookey at 06/02/2011 10:57 AM  Fredna Dow, [phone]

## 2011-06-02 NOTE — Progress Notes (Addendum)
Pt dc pending, pt had blood in stool prior to dc, B/P 133/79, hgb 11.5. GI to fu in am

## 2011-06-02 NOTE — Progress Notes (Signed)
Addendum: EGD showed one moderate sized duodenal and one small antral ulcer with several antral erosions consistent with NSAID use. There is no stigmata of hemorrhage. Okay to discharge today according to hospitalist judgment of overall medical status for reasons initially admitted. There is a CLOtest pending if he needs followup please have him call [phone] make a followup appointment with me in 3-4 weeks. I would treat him with a double his proton pump inhibitor with strict avoidance of NSAIDs. This was discussed with the patient's wife immediately after the procedure.

## 2011-06-02 NOTE — Progress Notes (Signed)
[facility] PROGRESS NOTE  [name]:[id number] DOB: 08/23/1945 DOA: 05/31/2011 PCP: Johny Blamer, MD, MD  Active Problems:  Syncope  Anemia  Hx of colon cancer, stage III  Melena  Hx of gastric ulcer  PUD (peptic ulcer disease)     Assessment/Plan: 1. Syncope - unclear cause . So far no arrythmia observed on the monitor, asked cardiology to see. await echocardiogram. Maybe dehydration. Appreciate Dr. Melburn Popper input. No further cardiac w/u nedeed.  2. Anemia - very mild. Doubt the cause of syncope, but yesterday had a black Bm with blood around it. There has been a drop of 4 grams of hemoglobin from known baseline so there is a possibility of PUD related to this patient's frequent NSAID usage. He is on PPI. EGD with PUD from nsaids without bleeding.    Code Status: full Family Communication: wife Disposition Plan: home  Lonia Blood, [place]  [facility] Pager [phone]  If 7PM-7AM, please contact night-coverage [email or web address] Password [email or web address] 06/02/2011, 5:26 PM   LOS: 2 days     Consultants:  [facility]    Subjective:   Objective: Filed Vitals:   06/02/11 1110 06/02/11 1120 06/02/11 1130 06/02/11 1400  BP: 94/49 114/62 136/60 119/72  Pulse:    73  Temp:    98 F (36.7 C)  TempSrc:    Oral  Resp: 13 9 10 20   Height:      Weight:      SpO2: 99% 99% 100% 99%    Intake/Output Summary (Last 24 hours) at 06/02/11 1726 Last data filed at 06/02/11 0900  Gross per 24 hour  Intake      0 ml  Output      0 ml  Net      0 ml    Exam:  Alert and oriented x3 CVS: RRR Rs: CTAB Abdomen : soft, NT Neuro - intact   Data Reviewed: Basic Metabolic Panel:  Lab 06/01/11 8295 05/31/11 2045  NA 137 137  K 4.1 4.3  CL 106 107  CO2 20 22  GLUCOSE 80 105*  BUN 34* 36*  CREATININE 0.80 0.93  CALCIUM 8.2* 8.1*  MG 1.9 --  PHOS 2.8 --   Liver Function Tests:  Lab 06/01/11 0540 05/31/11 2045  AST 14 14  ALT 10 10    ALKPHOS 42 44  BILITOT 0.4 0.3  PROT 6.0 5.7*  ALBUMIN 3.0* 2.8*    Lab 05/31/11 2045  LIPASE 22  AMYLASE --   No results found for this basename: AMMONIA:5 in the last 168 hours CBC:  Lab 06/02/11 0629 06/01/11 1615 06/01/11 0540 05/31/11 2045  WBC 6.0 5.9 7.0 10.3  NEUTROABS -- -- -- 8.4*  HGB 11.5* 11.5* 11.3* 11.4*  HCT 35.1* 35.2* 34.0* 35.1*  MCV 86.7 86.5 86.3 86.7  PLT 174 180 164 162   Cardiac Enzymes:  Lab 06/01/11 1125 06/01/11 0540 05/31/11 2045  CKTOTAL 79 95 126  CKMB 2.7 2.6 2.4  CKMBINDEX -- -- --  TROPONINI <0.30 <0.30 <0.30       Studies: Dg Chest 2 View  05/31/2011  *RADIOLOGY REPORT*  Clinical Data: Syncope.  CHEST - 2 VIEW  Comparison: PA and lateral chest 04/20/2010.  Findings: Lungs are clear.  The heart size is normal.  No pneumothorax or pleural fluid.  No focal bony abnormality.  IMPRESSION: Negative chest.  Original Report Authenticated By: Bernadene Bell. Maricela Curet, M.D.   Ct Head Wo Contrast  05/31/2011  *RADIOLOGY REPORT*  Clinical Data: Hypotension.  Near-syncope.  CT HEAD WITHOUT CONTRAST  Technique:  Contiguous axial images were obtained from the base of the skull through the vertex without contrast.  Comparison: Head CT scan 03/24/2006.  Findings: The brain appears normal without evidence of infarction, hemorrhage, mass lesion, mass effect, midline shift or abnormal extra-axial fluid collection.  No hydrocephalus or pneumocephalus. Mucosal thickening right maxillary sinus is noted.  IMPRESSION: No acute intracranial abnormality.  Original Report Authenticated By: Bernadene Bell. D'ALESSIO, M.D.    Scheduled Meds:    . docusate sodium  100 mg Oral BID  . pantoprazole  40 mg Oral Q1200  . sodium chloride  3 mL Intravenous Q12H  . DISCONTD: aspirin EC  81 mg Oral Daily   Continuous Infusions:    . DISCONTD: sodium chloride 500 mL (06/02/11 1042)

## 2011-06-02 NOTE — Consult Note (Signed)
[facility] Consult Note  Referring Provider: No ref. provider found Primary Care Physician:  Johny Blamer, MD, MD Primary Gastroenterologist:  Dr.  Antony Contras Complaint: Black stools and syncope HPI: [name] is an 66 y.o. white male  who presented with syncope and after negative cardiac workup was about to be discharge when he had a large black stool. His hemoglobin was 11.5 which is down from a baseline of 15 at some time recent. His initial stool is heme-negative. His BUN was elevated at 36 with a normal creatinine. He has a history of colon cancer with recent followup colonoscopy showed no recurrence. He has a history of peptic ulcer disease related to NSAID use by EGD in 2008.  Past Medical History  Diagnosis Date  . Hypertension   . colon ca dx'd 05/2006    surg only    History reviewed. No pertinent past surgical history.  Medications Prior to Admission  Medication Sig Dispense Refill  . DISCONTD: losartan (COZAAR) 50 MG tablet Take 50 mg by mouth daily.        Allergies: No Known Allergies  Family History  Problem Relation Age of Onset  . Heart disease Mother   . Lung cancer Mother   . Heart disease Father   . Prostate cancer Father     Social History:  reports that he has quit smoking. He does not have any smokeless tobacco history on file. He reports that he does not drink alcohol or use illicit drugs.  Review of Systems: negative except as above   Blood pressure 127/76, pulse 80, temperature 97.8 F (36.6 C), temperature source Oral, resp. rate 18, SpO2 97.00%. Head: Normocephalic, without obvious abnormality, atraumatic Neck: no adenopathy, no carotid bruit, no JVD, supple, symmetrical, trachea midline and thyroid not enlarged, symmetric, no tenderness/mass/nodules Resp: clear to auscultation bilaterally Cardio: regular rate and rhythm, S1, S2 normal, no murmur, click, rub or gallop GI: Abdomen soft nondistended with normoactive bowel sounds  no hepatosplenomegaly mass or guarding Extremities: extremities normal, atraumatic, no cyanosis or edema  Results for orders placed during the hospital encounter of 05/31/11 (from the past 48 hour(s))  CBC     Status: Abnormal   Collection Time   05/31/11  8:45 PM      Component Value Range Comment   WBC 10.3  4.0 - 10.5 (K/uL)    RBC 4.05 (*) 4.22 - 5.81 (MIL/uL)    Hemoglobin 11.4 (*) 13.0 - 17.0 (g/dL)    HCT 16.1 (*) 09.6 - 52.0 (%)    MCV 86.7  78.0 - 100.0 (fL)    MCH 28.1  26.0 - 34.0 (pg)    MCHC 32.5  30.0 - 36.0 (g/dL)    RDW 04.5  40.9 - 81.1 (%)    Platelets 162  150 - 400 (K/uL)   DIFFERENTIAL     Status: Abnormal   Collection Time   05/31/11  8:45 PM      Component Value Range Comment   Neutrophils Relative 81 (*) 43 - 77 (%)    Neutro Abs 8.4 (*) 1.7 - 7.7 (K/uL)    Lymphocytes Relative 11 (*) 12 - 46 (%)    Lymphs Abs 1.1  0.7 - 4.0 (K/uL)    Monocytes Relative 6  3 - 12 (%)    Monocytes Absolute 0.6  0.1 - 1.0 (K/uL)    Eosinophils Relative 2  0 - 5 (%)    Eosinophils Absolute 0.2  0.0 - 0.7 (K/uL)  Basophils Relative 1  0 - 1 (%)    Basophils Absolute 0.1  0.0 - 0.1 (K/uL)   COMPREHENSIVE METABOLIC PANEL     Status: Abnormal   Collection Time   05/31/11  8:45 PM      Component Value Range Comment   Sodium 137  135 - 145 (mEq/L)    Potassium 4.3  3.5 - 5.1 (mEq/L)    Chloride 107  96 - 112 (mEq/L)    CO2 22  19 - 32 (mEq/L)    Glucose, Bld 105 (*) 70 - 99 (mg/dL)    BUN 36 (*) 6 - 23 (mg/dL)    Creatinine, Ser 4.54  0.50 - 1.35 (mg/dL)    Calcium 8.1 (*) 8.4 - 10.5 (mg/dL)    Total Protein 5.7 (*) 6.0 - 8.3 (g/dL)    Albumin 2.8 (*) 3.5 - 5.2 (g/dL)    AST 14  0 - 37 (U/L)    ALT 10  0 - 53 (U/L)    Alkaline Phosphatase 44  39 - 117 (U/L)    Total Bilirubin 0.3  0.3 - 1.2 (mg/dL)    GFR calc non Af Amer 88 (*) >90 (mL/min)    GFR calc Af Amer >90  >90 (mL/min)   CK TOTAL AND CKMB     Status: Normal   Collection Time   05/31/11  8:45 PM       Component Value Range Comment   Total CK 126  7 - 232 (U/L)    CK, MB 2.4  0.3 - 4.0 (ng/mL)    Relative Index 1.9  0.0 - 2.5    TROPONIN I     Status: Normal   Collection Time   05/31/11  8:45 PM      Component Value Range Comment   Troponin I <0.30  <0.30 (ng/mL)   LIPASE, BLOOD     Status: Normal   Collection Time   05/31/11  8:45 PM      Component Value Range Comment   Lipase 22  11 - 59 (U/L)   SAMPLE TO BLOOD BANK     Status: Normal   Collection Time   05/31/11  9:02 PM      Component Value Range Comment   Blood Bank Specimen SAMPLE AVAILABLE FOR TESTING      Sample Expiration 06/01/2011     OCCULT BLOOD, POC DEVICE     Status: Normal   Collection Time   05/31/11 11:44 PM      Component Value Range Comment   Fecal Occult Bld NEGATIVE     D-DIMER, QUANTITATIVE     Status: Normal   Collection Time   06/01/11  1:10 AM      Component Value Range Comment   D-Dimer, Quant <0.22  0.00 - 0.48 (ug/mL-FEU)   MAGNESIUM     Status: Normal   Collection Time   06/01/11  5:40 AM      Component Value Range Comment   Magnesium 1.9  1.5 - 2.5 (mg/dL)   PHOSPHORUS     Status: Normal   Collection Time   06/01/11  5:40 AM      Component Value Range Comment   Phosphorus 2.8  2.3 - 4.6 (mg/dL)   TSH     Status: Normal   Collection Time   06/01/11  5:40 AM      Component Value Range Comment   TSH 0.597  0.350 - 4.500 (uIU/mL)   COMPREHENSIVE METABOLIC PANEL  Status: Abnormal   Collection Time   06/01/11  5:40 AM      Component Value Range Comment   Sodium 137  135 - 145 (mEq/L)    Potassium 4.1  3.5 - 5.1 (mEq/L)    Chloride 106  96 - 112 (mEq/L)    CO2 20  19 - 32 (mEq/L)    Glucose, Bld 80  70 - 99 (mg/dL)    BUN 34 (*) 6 - 23 (mg/dL)    Creatinine, Ser 4.09  0.50 - 1.35 (mg/dL)    Calcium 8.2 (*) 8.4 - 10.5 (mg/dL)    Total Protein 6.0  6.0 - 8.3 (g/dL)    Albumin 3.0 (*) 3.5 - 5.2 (g/dL)    AST 14  0 - 37 (U/L) HEMOLYSIS AT THIS LEVEL MAY AFFECT RESULT   ALT 10  0 - 53 (U/L)      Alkaline Phosphatase 42  39 - 117 (U/L)    Total Bilirubin 0.4  0.3 - 1.2 (mg/dL)    GFR calc non Af Amer >90  >90 (mL/min)    GFR calc Af Amer >90  >90 (mL/min)   CBC     Status: Abnormal   Collection Time   06/01/11  5:40 AM      Component Value Range Comment   WBC 7.0  4.0 - 10.5 (K/uL)    RBC 3.94 (*) 4.22 - 5.81 (MIL/uL)    Hemoglobin 11.3 (*) 13.0 - 17.0 (g/dL)    HCT 81.1 (*) 91.4 - 52.0 (%)    MCV 86.3  78.0 - 100.0 (fL)    MCH 28.7  26.0 - 34.0 (pg)    MCHC 33.2  30.0 - 36.0 (g/dL)    RDW 78.2  95.6 - 21.3 (%)    Platelets 164  150 - 400 (K/uL)   CARDIAC PANEL(CRET KIN+CKTOT+MB+TROPI)     Status: Normal   Collection Time   06/01/11  5:40 AM      Component Value Range Comment   Total CK 95  7 - 232 (U/L)    CK, MB 2.6  0.3 - 4.0 (ng/mL)    Troponin I <0.30  <0.30 (ng/mL)    Relative Index RELATIVE INDEX IS INVALID  0.0 - 2.5    CARDIAC PANEL(CRET KIN+CKTOT+MB+TROPI)     Status: Normal   Collection Time   06/01/11 11:25 AM      Component Value Range Comment   Total CK 79  7 - 232 (U/L)    CK, MB 2.7  0.3 - 4.0 (ng/mL)    Troponin I <0.30  <0.30 (ng/mL)    Relative Index RELATIVE INDEX IS INVALID  0.0 - 2.5    VITAMIN B12     Status: Normal   Collection Time   06/01/11  4:15 PM      Component Value Range Comment   Vitamin B-12 420  211 - 911 (pg/mL)   FOLATE     Status: Normal   Collection Time   06/01/11  4:15 PM      Component Value Range Comment   Folate 15.1     IRON AND TIBC     Status: Abnormal   Collection Time   06/01/11  4:15 PM      Component Value Range Comment   Iron 47  42 - 135 (ug/dL)    TIBC 086  578 - 469 (ug/dL)    Saturation Ratios 18 (*) 20 - 55 (%)    UIBC 214  125 -  400 (ug/dL)   FERRITIN     Status: Normal   Collection Time   06/01/11  4:15 PM      Component Value Range Comment   Ferritin 72  22 - 322 (ng/mL)   RETICULOCYTES     Status: Abnormal   Collection Time   06/01/11  4:15 PM      Component Value Range Comment   Retic Ct Pct  1.6  0.4 - 3.1 (%)    RBC. 4.07 (*) 4.22 - 5.81 (MIL/uL)    Retic Count, Manual 65.1  19.0 - 186.0 (K/uL)   CBC     Status: Abnormal   Collection Time   06/01/11  4:15 PM      Component Value Range Comment   WBC 5.9  4.0 - 10.5 (K/uL)    RBC 4.07 (*) 4.22 - 5.81 (MIL/uL)    Hemoglobin 11.5 (*) 13.0 - 17.0 (g/dL)    HCT 16.1 (*) 09.6 - 52.0 (%)    MCV 86.5  78.0 - 100.0 (fL)    MCH 28.3  26.0 - 34.0 (pg)    MCHC 32.7  30.0 - 36.0 (g/dL)    RDW 04.5  40.9 - 81.1 (%)    Platelets 180  150 - 400 (K/uL)   CBC     Status: Abnormal   Collection Time   06/02/11  6:29 AM      Component Value Range Comment   WBC 6.0  4.0 - 10.5 (K/uL)    RBC 4.05 (*) 4.22 - 5.81 (MIL/uL)    Hemoglobin 11.5 (*) 13.0 - 17.0 (g/dL)    HCT 91.4 (*) 78.2 - 52.0 (%)    MCV 86.7  78.0 - 100.0 (fL)    MCH 28.4  26.0 - 34.0 (pg)    MCHC 32.8  30.0 - 36.0 (g/dL)    RDW 95.6  21.3 - 08.6 (%)    Platelets 174  150 - 400 (K/uL)    Dg Chest 2 View  05/31/2011  *RADIOLOGY REPORT*  Clinical Data: Syncope.  CHEST - 2 VIEW  Comparison: PA and lateral chest 04/20/2010.  Findings: Lungs are clear.  The heart size is normal.  No pneumothorax or pleural fluid.  No focal bony abnormality.  IMPRESSION: Negative chest.  Original Report Authenticated By: Bernadene Bell. Maricela Curet, M.D.   Ct Head Wo Contrast  05/31/2011  *RADIOLOGY REPORT*  Clinical Data: Hypotension.  Near-syncope.  CT HEAD WITHOUT CONTRAST  Technique:  Contiguous axial images were obtained from the base of the skull through the vertex without contrast.  Comparison: Head CT scan 03/24/2006.  Findings: The brain appears normal without evidence of infarction, hemorrhage, mass lesion, mass effect, midline shift or abnormal extra-axial fluid collection.  No hydrocephalus or pneumocephalus. Mucosal thickening right maxillary sinus is noted.  IMPRESSION: No acute intracranial abnormality.  Original Report Authenticated By: Bernadene Bell. Maricela Curet, M.D.    Assessment: Melena likely  due to bleeding peptic ulcer Plan:  EGD today. Jacara Benito C 06/02/2011, 8:30 AM

## 2011-06-03 ENCOUNTER — Encounter (HOSPITAL_COMMUNITY): Payer: Self-pay | Admitting: Gastroenterology

## 2011-06-25 ENCOUNTER — Ambulatory Visit
Admission: RE | Admit: 2011-06-25 | Discharge: 2011-06-25 | Disposition: A | Discharge status: Home / Self Care | Payer: Managed Care, Other (non HMO) | Source: Ambulatory Visit | Attending: Internal Medicine | Admitting: Internal Medicine

## 2011-06-25 DIAGNOSIS — C189 Malignant neoplasm of colon, unspecified: Secondary | ICD-10-CM

## 2011-06-25 DIAGNOSIS — R911 Solitary pulmonary nodule: Secondary | ICD-10-CM

## 2011-06-26 ENCOUNTER — Ambulatory Visit (HOSPITAL_BASED_OUTPATIENT_CLINIC_OR_DEPARTMENT_OTHER): Payer: Managed Care, Other (non HMO) | Admitting: Internal Medicine

## 2011-06-26 VITALS — BP 140/89 | HR 69 | Temp 97.1°F | Ht 67.0 in | Wt 183.9 lb

## 2011-06-26 DIAGNOSIS — Z85038 Personal history of other malignant neoplasm of large intestine: Secondary | ICD-10-CM

## 2011-06-26 DIAGNOSIS — C187 Malignant neoplasm of sigmoid colon: Secondary | ICD-10-CM

## 2011-06-26 NOTE — Progress Notes (Signed)
[facility] [phone]   [phone]  OFFICE PROGRESS NOTE  Jeffrey Blamer, MD, MD [facility] 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   PRIOR THERAPY: Status post sigmoid colon resection with dissection of 20 lymph nodes that were negative for malignancy. This was performed June 23, 2006 under the care of Dr. Gerrit Friends.   CURRENT THERAPY: Observation.  INTERVAL HISTORY: [name] 66 y.o. male returns to the clinic today for followup visit accompanied by his wife. The patient has no complaints today. He was found on the previous scan to have a suspicious nodule in the lung and he has repeat CT scan of the chest performed recently for evaluation of this abnormality. He denied having any significant chest pain or shortness of breath, no cough or hemoptysis. He has no significant weight loss or night sweats. No nausea or vomiting or abdominal pain.  MEDICAL HISTORY: Past Medical History  Diagnosis Date  . Hypertension   . colon ca dx'd 05/2006    surg only    ALLERGIES:   has no known allergies.  MEDICATIONS:  Current Outpatient Prescriptions  Medication Sig Dispense Refill  . losartan (COZAAR) 50 MG tablet Take 50 mg by mouth daily.      Marland Kitchen omeprazole (PRILOSEC OTC) 20 MG tablet Take 2 tablets (40 mg total) by mouth daily.  30 tablet  0  . traMADol (ULTRAM) 50 MG tablet Take 50 mg by mouth every 6 (six) hours as needed. 1-2 every 6 hours prn pain        SURGICAL HISTORY:  Past Surgical History  Procedure Date  . Colon surgery   . Esophagogastroduodenoscopy 06/02/2011    Procedure: ESOPHAGOGASTRODUODENOSCOPY (EGD);  Surgeon: Barrie Folk, MD;  Location: [facility] ENDOSCOPY;  Service: Endoscopy;  Laterality: N/A;    REVIEW OF SYSTEMS:  A comprehensive review of systems was negative.   PHYSICAL EXAMINATION: General  appearance: alert, cooperative and no distress Neck: no adenopathy Lymph nodes: Cervical, supraclavicular, and axillary nodes normal. Resp: clear to auscultation bilaterally Cardio: regular rate and rhythm, S1, S2 normal, no murmur, click, rub or gallop GI: soft, non-tender; bowel sounds normal; no masses,  no organomegaly Extremities: extremities normal, atraumatic, no cyanosis or edema Neurologic: Alert and oriented X 3, normal strength and tone. Normal symmetric reflexes. Normal coordination and gait  ECOG PERFORMANCE STATUS: 0 - Asymptomatic  Blood pressure 140/89, pulse 69, temperature 97.1 F (36.2 C), temperature source Oral, height 5\' 7"  (1.702 m), weight 183 lb 14.4 oz (83.416 kg).  LABORATORY DATA: Lab Results  Component Value Date   WBC 6.0 06/02/2011   HGB 11.5* 06/02/2011   HCT 35.1* 06/02/2011   MCV 86.7 06/02/2011   PLT 174 06/02/2011      Chemistry      Component Value Date/Time   NA 137 06/01/2011 0540   NA 139 04/26/2011 0835   K 4.1 06/01/2011 0540   K 4.0 04/26/2011 0835   CL 106 06/01/2011 0540   CL 101 04/26/2011 0835   CO2 20 06/01/2011 0540   CO2 28 04/26/2011 0835   BUN 34* 06/01/2011 0540   BUN 14 04/26/2011 0835   CREATININE 0.80 06/01/2011 0540   CREATININE 1.2 04/26/2011 0835      Component Value Date/Time   CALCIUM 8.2* 06/01/2011 0540   CALCIUM 8.5 04/26/2011 0835   ALKPHOS 42  06/01/2011 0540   ALKPHOS 67 04/26/2011 0835   AST 14 06/01/2011 0540   AST 29 04/26/2011 0835   ALT 10 06/01/2011 0540   BILITOT 0.4 06/01/2011 0540   BILITOT 0.60 04/26/2011 0835       RADIOGRAPHIC STUDIES: Dg Chest 2 View  05/31/2011  *RADIOLOGY REPORT*  Clinical Data: Syncope.  CHEST - 2 VIEW  Comparison: PA and lateral chest 04/20/2010.  Findings: Lungs are clear.  The heart size is normal.  No pneumothorax or pleural fluid.  No focal bony abnormality.  IMPRESSION: Negative chest.  Original Report Authenticated By: Bernadene Bell. Maricela Curet, M.D.   Ct Head Wo Contrast  05/31/2011  *RADIOLOGY  REPORT*  Clinical Data: Hypotension.  Near-syncope.  CT HEAD WITHOUT CONTRAST  Technique:  Contiguous axial images were obtained from the base of the skull through the vertex without contrast.  Comparison: Head CT scan 03/24/2006.  Findings: The brain appears normal without evidence of infarction, hemorrhage, mass lesion, mass effect, midline shift or abnormal extra-axial fluid collection.  No hydrocephalus or pneumocephalus. Mucosal thickening right maxillary sinus is noted.  IMPRESSION: No acute intracranial abnormality.  Original Report Authenticated By: Bernadene Bell. Maricela Curet, M.D.   Ct Chest Wo Contrast  06/25/2011  *RADIOLOGY REPORT*  Clinical Data: Pulmonary nodule.  Ex-smoker; 20 years ago.  Colon cancer 5 years ago.  CT CHEST WITHOUT CONTRAST  Technique:  Multidetector CT imaging of the chest was performed following the standard protocol without IV contrast.  Comparison: Plain film of 05/31/2011.  Chest CT of 07/08/2006. Abdominal pelvic CT of 04/26/2011.  Findings: Lung windows demonstrate mild centrilobular emphysema.  Perifissural nodule along the right minor fissure is similar to 2008.  This measures 4 mm on image 33.  Likely a subpleural lymph node.  Mild interstitial thickening in the inferior right middle lobe on image 43 is likely post infectious or inflammatory.  Lingular volume loss/scarring is similar.  Soft tissue windows demonstrate normal heart size without pericardial or pleural effusion.  No mediastinal or definite hilar adenopathy, given limitations of unenhanced CT.  Limited abdominal imaging demonstrates splenule. Normal adrenal glands.  Left medial renal cyst.  Bone island within the left humeral head.  IMPRESSION:  1. No acute process or evidence of metastatic disease in the chest. 2.  The perifissural nodule along the right minor fissure is similar to 2008, consistent with a benign etiology.  Most likely a subpleural lymph node. 3. Mild centrilobular emphysema.  Original Report  Authenticated By: Consuello Bossier, M.D.    ASSESSMENT: This is a very pleasant 66 years old white male with history of stage II a: Adenocarcinoma status post resection and has been observation for the last 5 years with no evidence for disease recurrence. CT scan of the chest showed no acute abnormalities or evidence of metastatic disease to the chest.  PLAN: I discussed the scan results with the patient and his wife. I recommended for him to continue his routine followup visit with his primary care physician and gastroenterologist as previously scheduled. The patient has no evidence for disease recurrence in the last 5 years and I will release him from the clinic at this point. I would be happy to see him in the future on an as-needed basis. The patient and his wife agreed to the current plan.  All questions were answered. The patient knows to call the clinic with any problems, questions or concerns. We can certainly see the patient much sooner if necessary.

## 2011-07-08 ENCOUNTER — Other Ambulatory Visit: Payer: Managed Care, Other (non HMO)

## 2012-08-30 IMAGING — CT CT HEAD W/O CM
1 series · 16 of 30 positions shown, 20 images · non-contrast
Comparison: Head CT scan 03/24/2006.

CLINICAL DATA: Hypotension.  Near-syncope.

CT HEAD WITHOUT CONTRAST
TECHNIQUE: Contiguous axial images were obtained from the base of
the skull through the vertex without contrast.

[Series 2: head routine 4.8 h37s · axial · 0.45mm/px · z∈[+1410,+1567]mm · 16 of 36 slices shown, 20 images]
[im 2/36  brain]
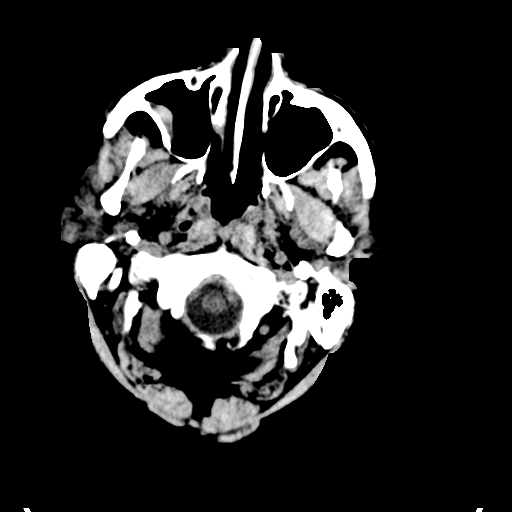
[im 2/36  bone]
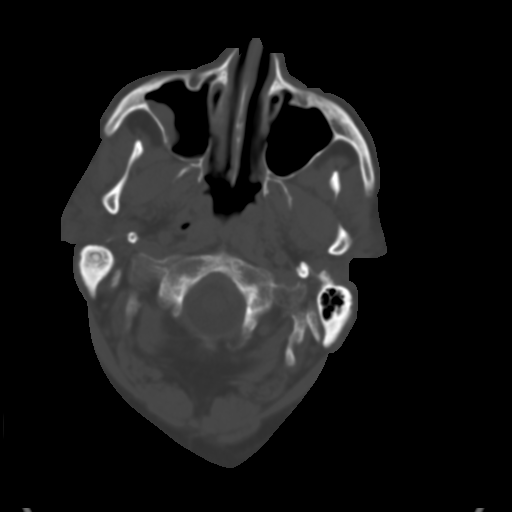
[im 4/36  brain]
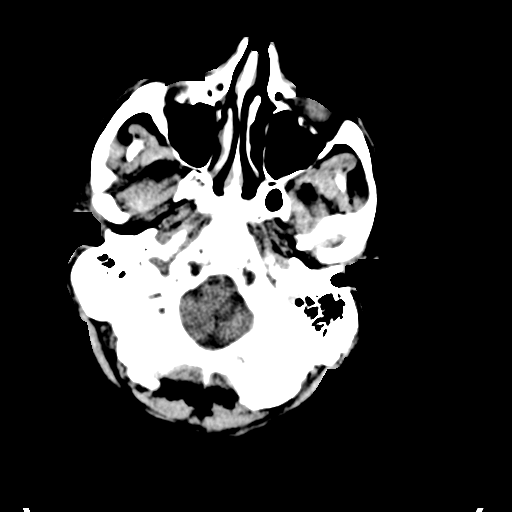
[im 7/36  brain]
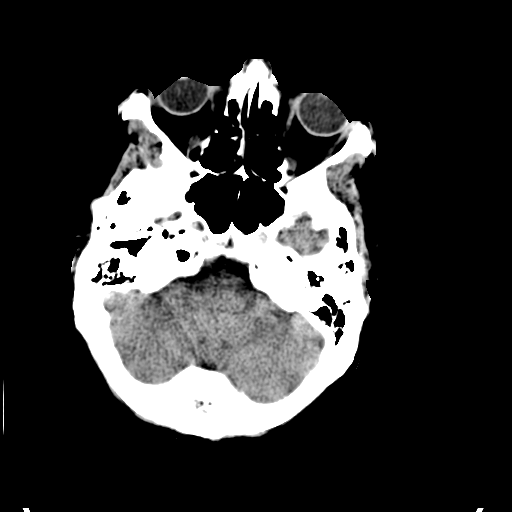
[im 9/36  brain]
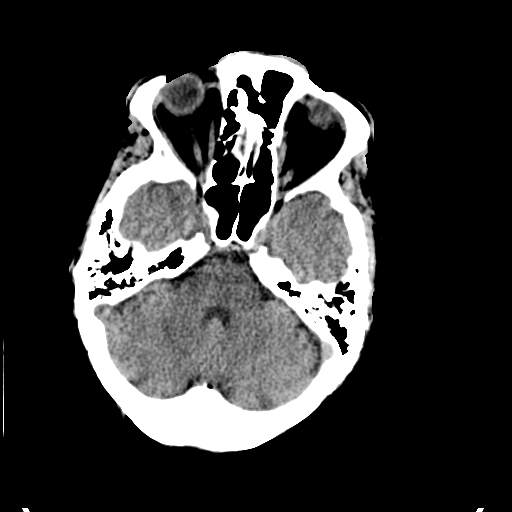
[im 10/36  brain]
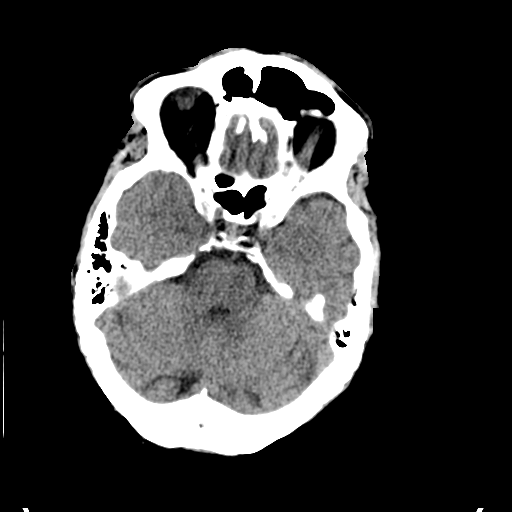
[im 10/36  bone]
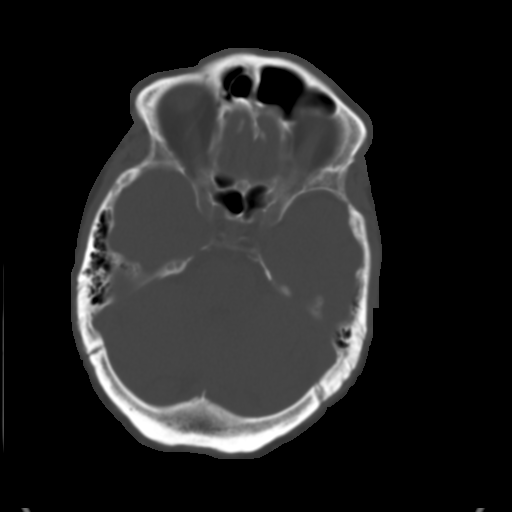
[im 13/36  brain]
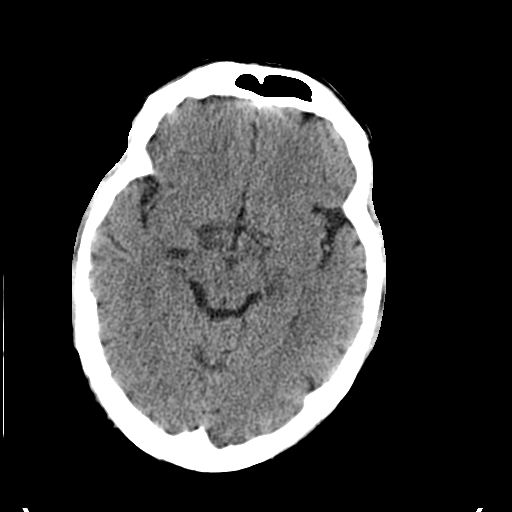
[im 15/36  brain]
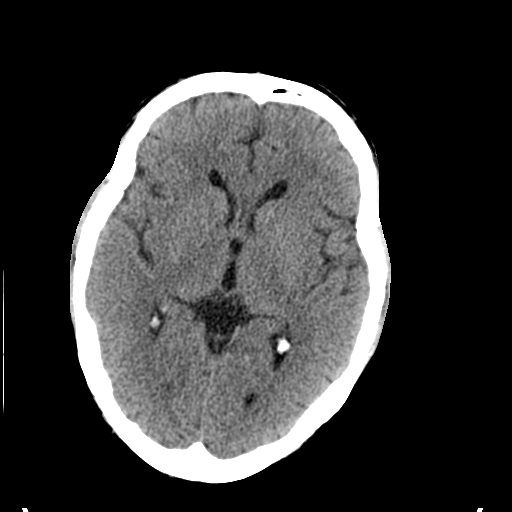
[im 17/36  brain]
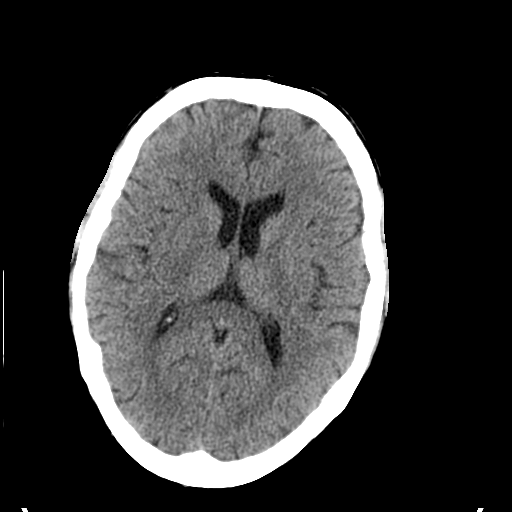
[im 19/36  brain]
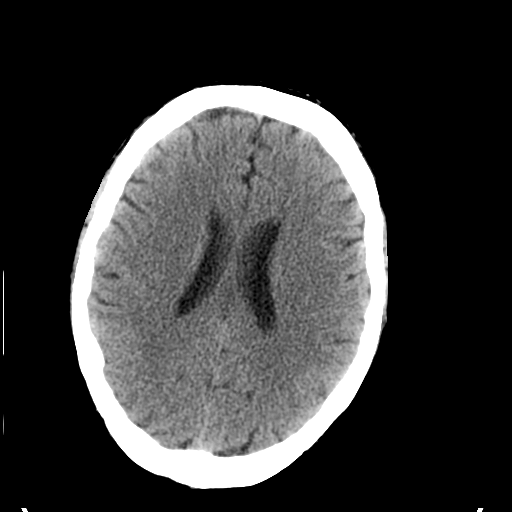
[im 19/36  bone]
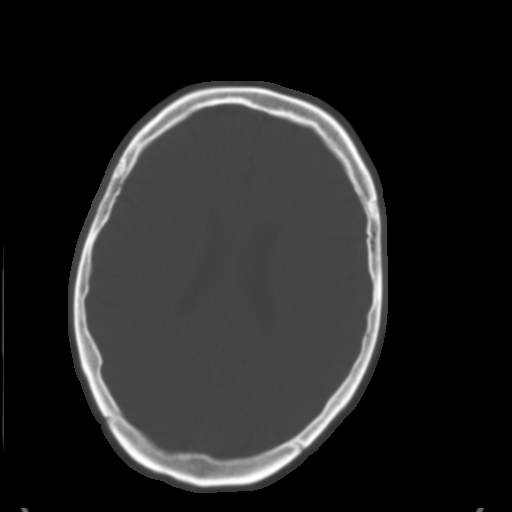
[im 21/36  brain]
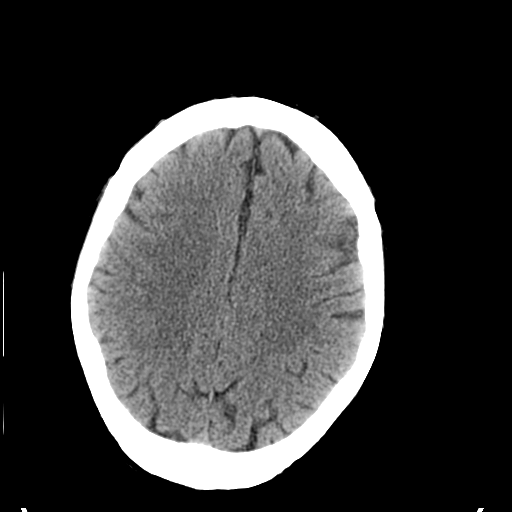
[im 23/36  brain]
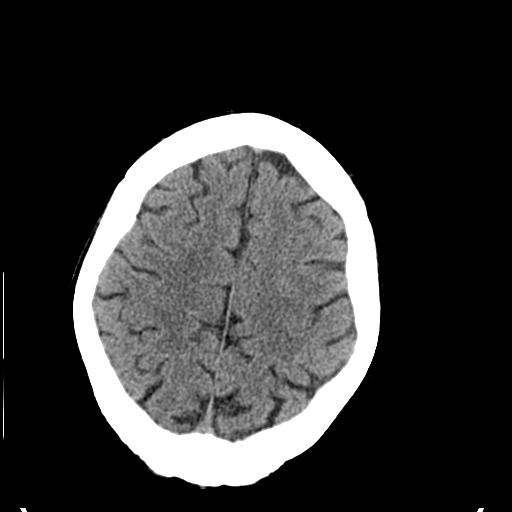
[im 26/36  brain]
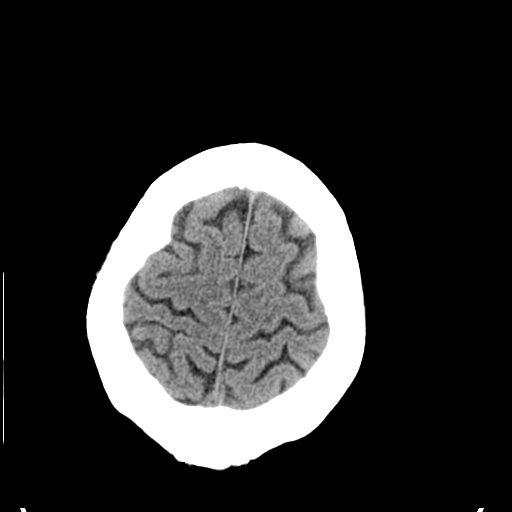
[im 27/36  brain]
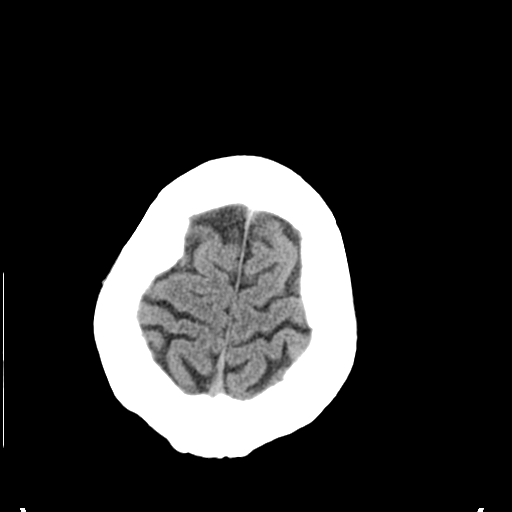
[im 27/36  bone]
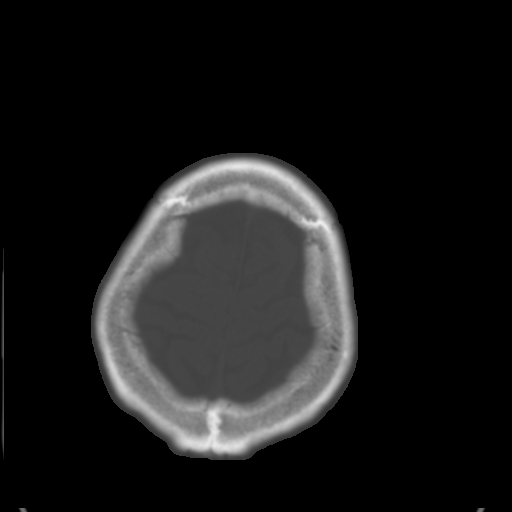
[im 29/36  brain]
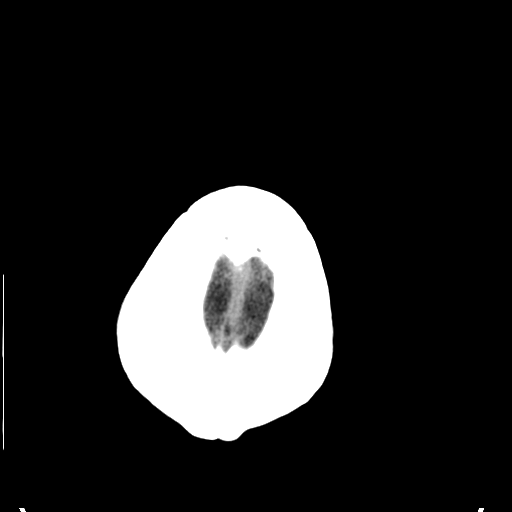
[im 32/36  brain]
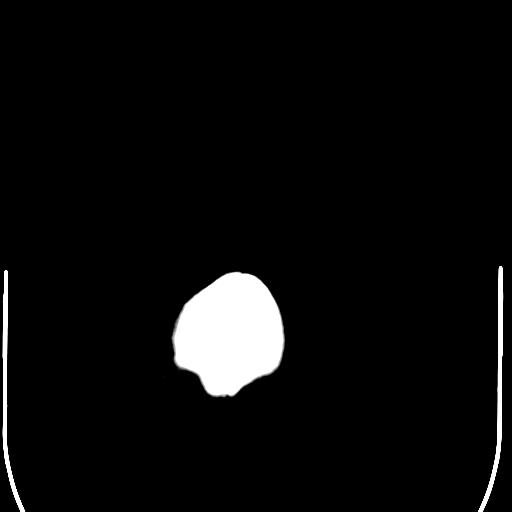
[im 34/36  brain]
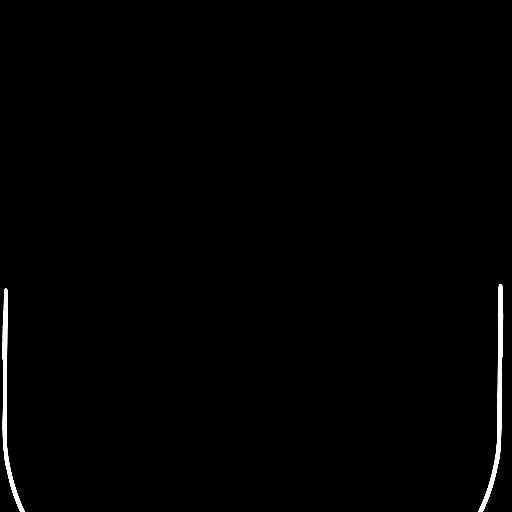

[16 of 30 positions shown; findings below may reference images not displayed]

FINDINGS: The brain appears normal without evidence of infarction,
hemorrhage, mass lesion, mass effect, midline shift or abnormal
extra-axial fluid collection.  No hydrocephalus or pneumocephalus.
Mucosal thickening right maxillary sinus is noted.
IMPRESSION: No acute intracranial abnormality.

## 2012-08-30 IMAGING — CR DG CHEST 2V
2 series · 2 of 2 positions shown · non-contrast
Comparison: PA and lateral chest 04/20/2010.

CLINICAL DATA: Syncope.

CHEST - 2 VIEW

[w chest pa]
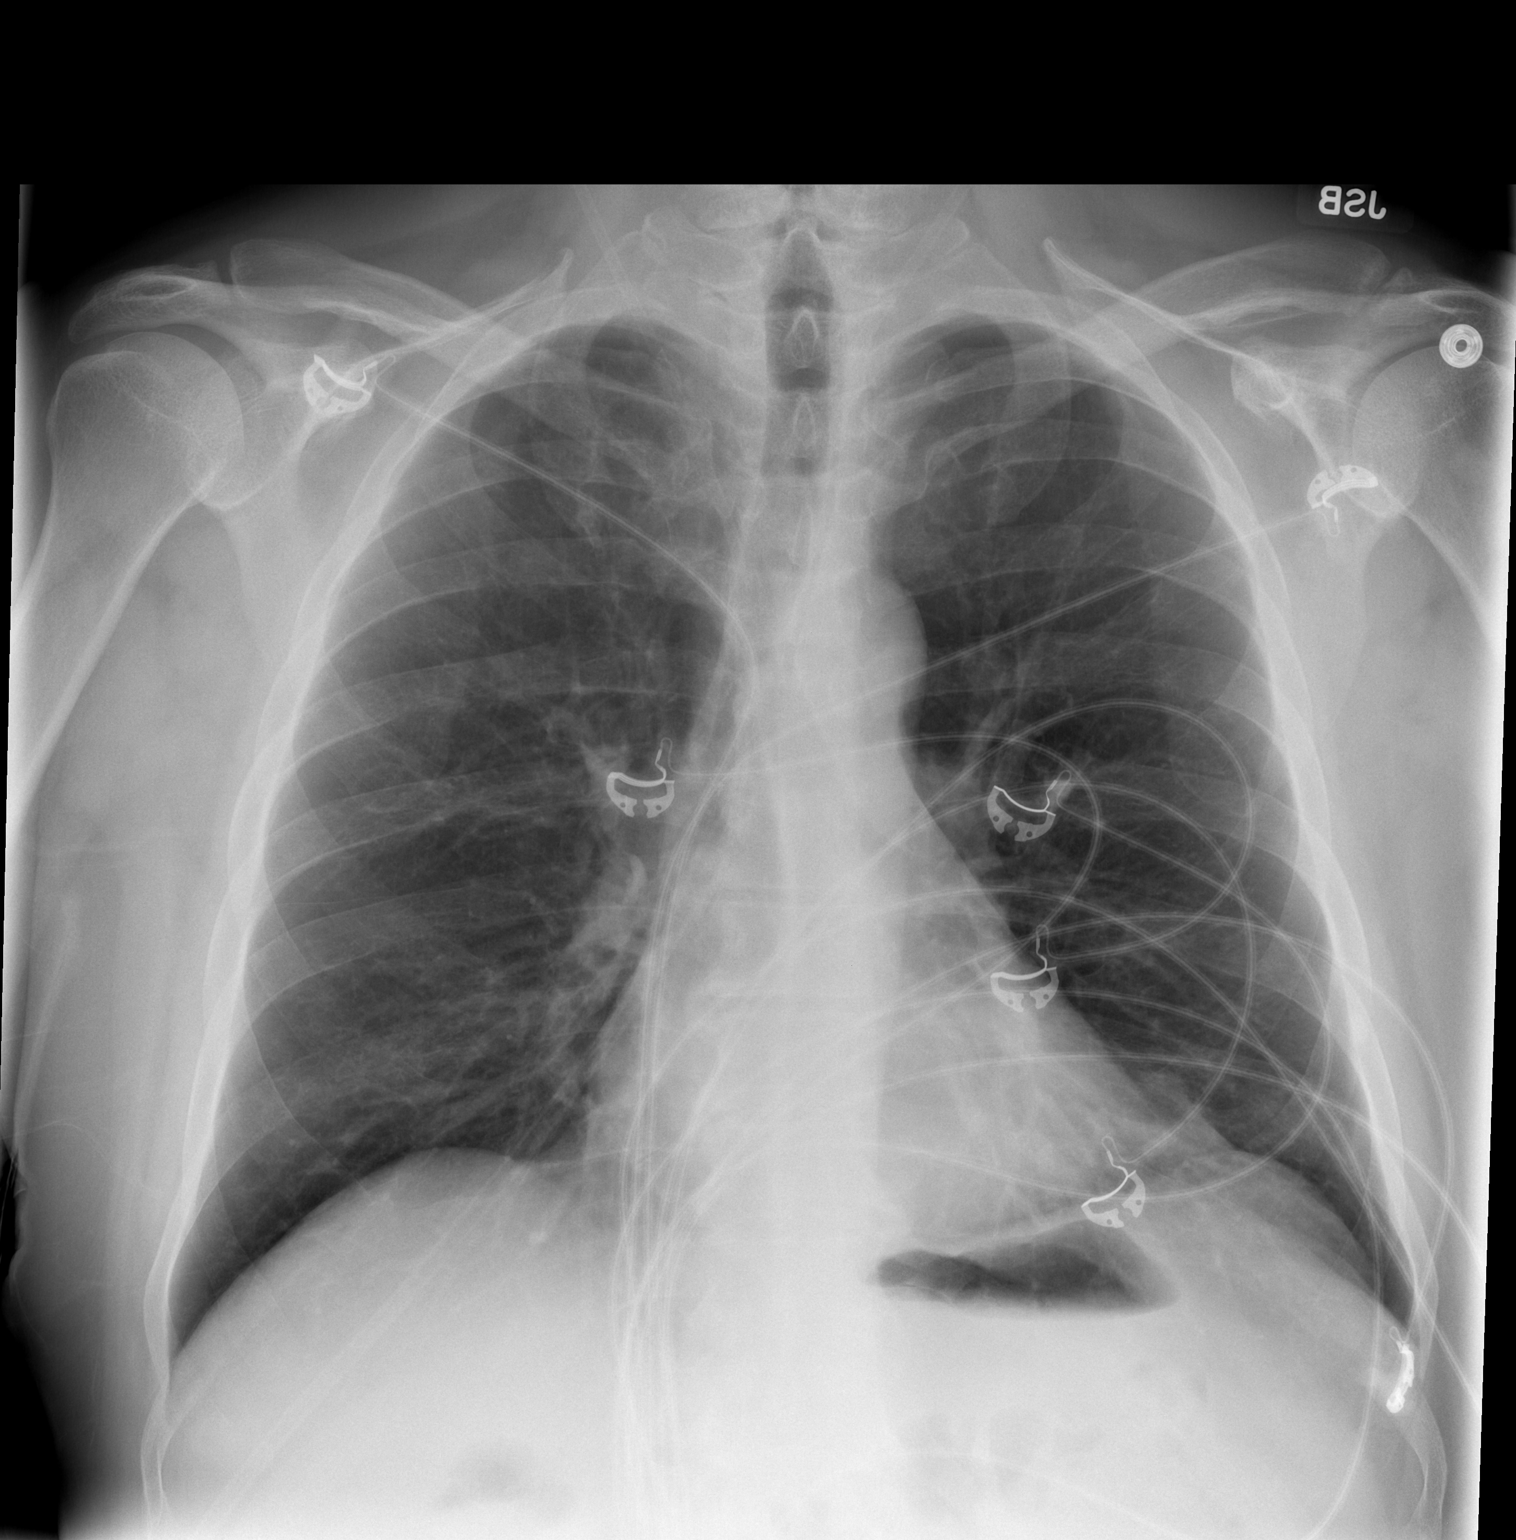

[w chest lat]
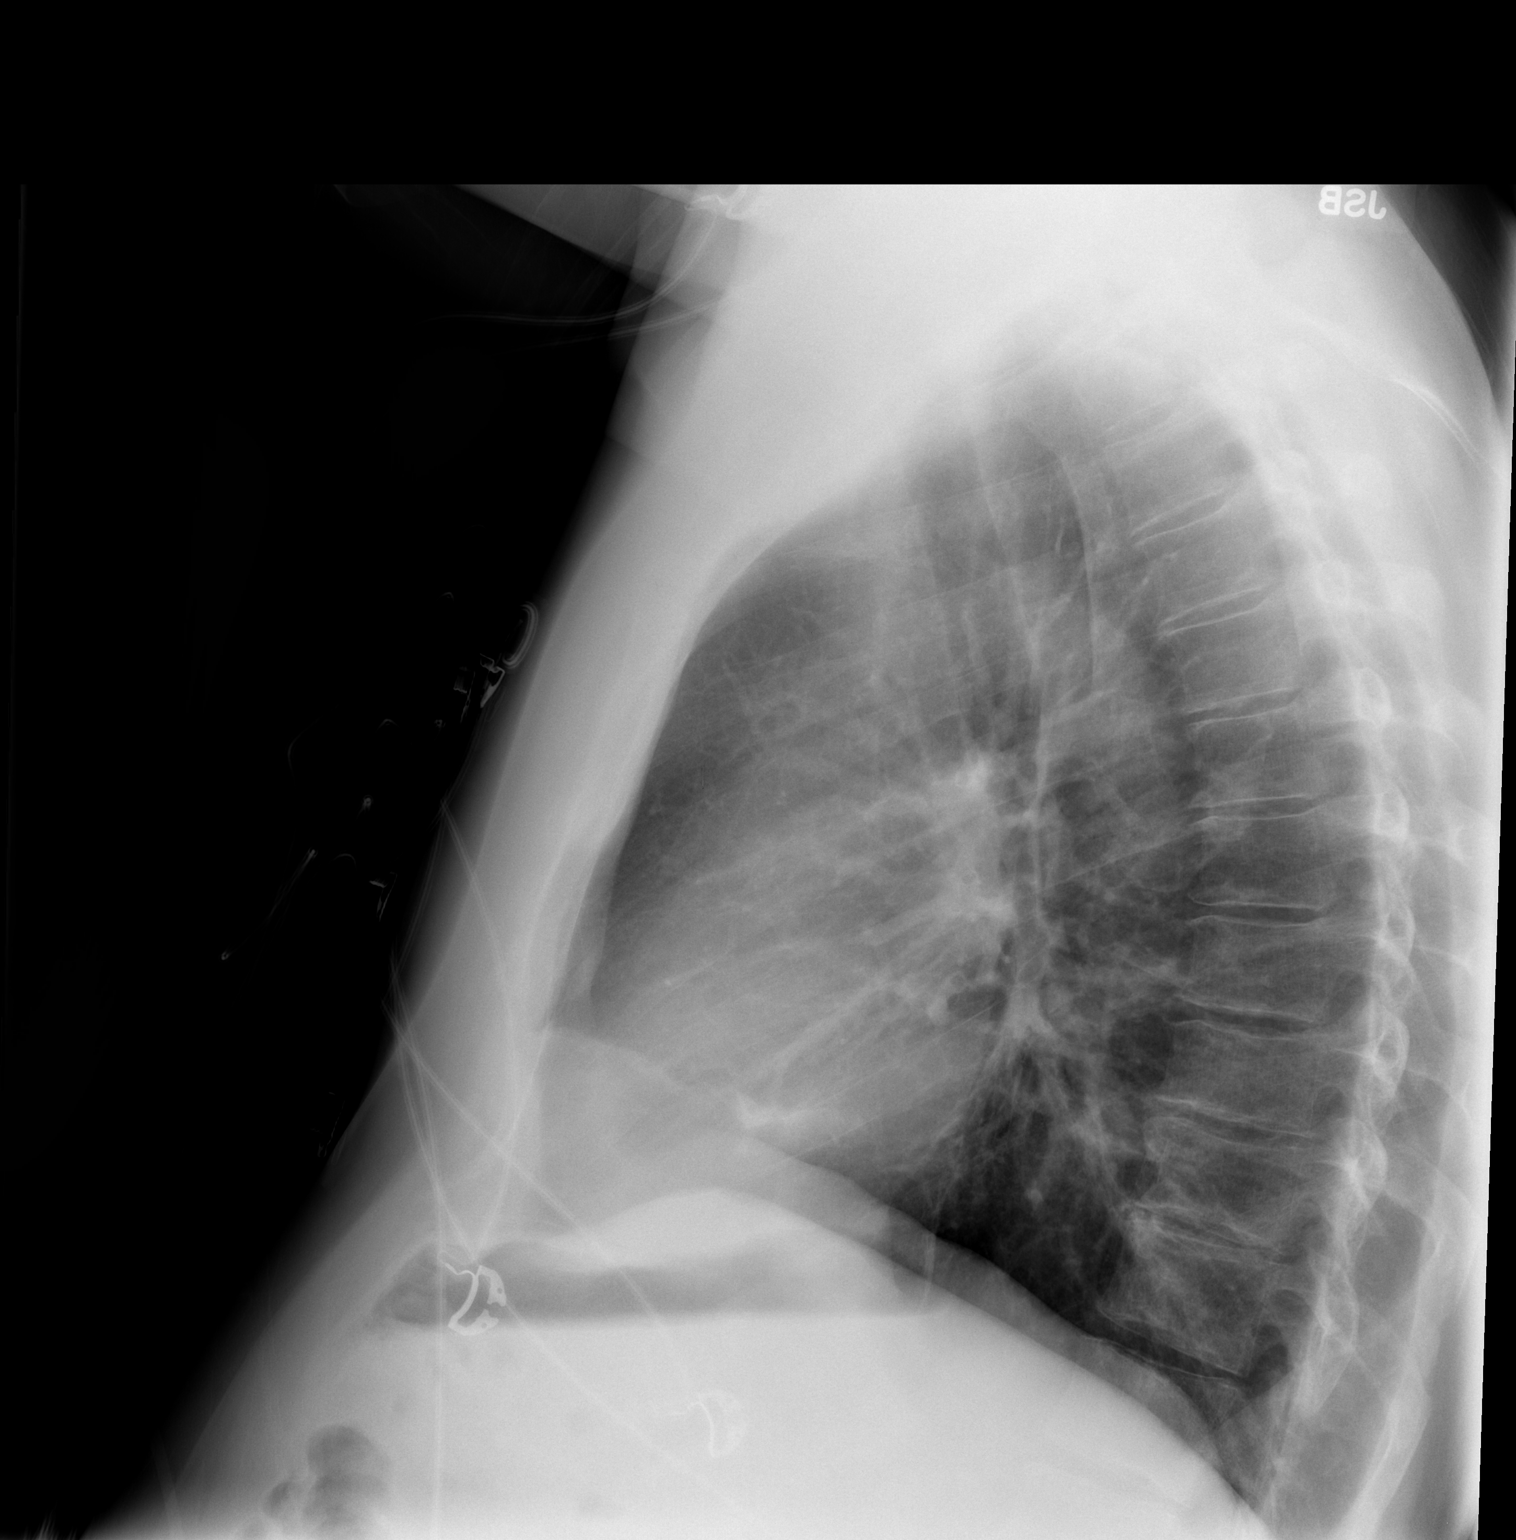

[2 of 2 positions shown; findings below may reference images not displayed]

FINDINGS: Lungs are clear.  The heart size is normal.  No
pneumothorax or pleural fluid.  No focal bony abnormality.
IMPRESSION: Negative chest.

## 2013-07-14 ENCOUNTER — Other Ambulatory Visit: Payer: Self-pay | Admitting: Family Medicine

## 2013-07-14 ENCOUNTER — Ambulatory Visit
Admission: RE | Admit: 2013-07-14 | Discharge: 2013-07-14 | Disposition: A | Discharge status: Home / Self Care | Payer: Managed Care, Other (non HMO) | Source: Ambulatory Visit | Attending: Family Medicine | Admitting: Family Medicine

## 2013-07-14 DIAGNOSIS — M549 Dorsalgia, unspecified: Secondary | ICD-10-CM

## 2013-09-20 ENCOUNTER — Other Ambulatory Visit: Payer: Self-pay | Admitting: Gastroenterology

## 2014-02-10 ENCOUNTER — Encounter: Payer: Self-pay | Admitting: *Deleted

## 2014-02-14 ENCOUNTER — Encounter: Payer: Self-pay | Admitting: Interventional Cardiology

## 2014-02-14 ENCOUNTER — Ambulatory Visit (INDEPENDENT_AMBULATORY_CARE_PROVIDER_SITE_OTHER): Payer: Managed Care, Other (non HMO) | Admitting: Interventional Cardiology

## 2014-02-14 VITALS — BP 122/68 | HR 76 | Ht 67.0 in | Wt 186.2 lb

## 2014-02-14 DIAGNOSIS — R55 Syncope and collapse: Secondary | ICD-10-CM

## 2014-02-14 DIAGNOSIS — I10 Essential (primary) hypertension: Secondary | ICD-10-CM

## 2014-02-14 DIAGNOSIS — R5383 Other fatigue: Secondary | ICD-10-CM | POA: Insufficient documentation

## 2014-02-14 NOTE — Patient Instructions (Signed)
Your physician recommends that you continue on your current medications as directed. Please refer to the Current Medication list given to you today.  Your physician has requested that you have an exercise tolerance test. For further information please visit [email or web address]. Please also follow instruction sheet, as given.  Your physician recommends that you schedule a follow-up appointment in: 2 months with Dr. Irish Lack.

## 2014-02-14 NOTE — Progress Notes (Signed)
Patient ID: [name], male   DOB: 12-24-1945, 69 y.o.   MRN: [id number]     Patient ID: ISIAIH HOLLENBACH MRN: [id number] DOB/AGE: 06/24/1945 69 y.o.   Referring Physician Dr. Samara Snide   Reason for Consultation: fatigue  HPI: 69 y/o who has a family h/o MI.  His father had a pacer in his 87s.  His mother had an MI.  He has several brother and sisters.  No early CAD that he knows of.  Cancer is prevalent in the family.  No tobacco in 29 years.    Working is his most strenuous activity.  No chest pain with this.  He does report easy fatiguability.  He cannot get all of his planned tasks done on some days.    He had colon cancer surgery 7 years ago.  He had a presumed ulcer in 2012 and he was told to not use Goody powders after that.  He had syncope and was anemic.  He was hospitalized at that time.  No bleeding issues since that time.     Current Outpatient Prescriptions  Medication Sig Dispense Refill  . losartan (COZAAR) 50 MG tablet Take 50 mg by mouth daily.     No current facility-administered medications for this visit.   Past Medical History  Diagnosis Date  . Hypertension   . colon ca dx'd 05/2006    surg only  . PUD (peptic ulcer disease)   . Migraine headache   . Polio   . Liver disease     Family History  Problem Relation Age of Onset  . Heart disease Mother   . Lung cancer Mother   . Heart disease Father   . Prostate cancer Father     History   Social History  . Marital Status: Married    Spouse Name: N/A    Number of Children: N/A  . Years of Education: N/A   Occupational History  . Not on file.   Social History Main Topics  . Smoking status: Former Research scientist (life sciences)  . Smokeless tobacco: Not on file     Comment: [place]  . Alcohol Use: No  . Drug Use: No  . Sexual Activity: Not on file   Other Topics Concern  . Not on file   Social History Narrative    Past Surgical History  Procedure Laterality Date  . Colon surgery    .  Esophagogastroduodenoscopy  06/02/2011    Procedure: ESOPHAGOGASTRODUODENOSCOPY (EGD);  Surgeon: Missy Sabins, MD;  Location: [facility] ENDOSCOPY;  Service: Endoscopy;  Laterality: N/A;      (Not in a hospital admission)  Review of systems complete and found to be negative unless listed above .  No nausea, vomiting.  No fever chills, No focal weakness,  No palpitations.  Physical Exam: Filed Vitals:   02/14/14 1109  BP: 122/68  Pulse: 76    Weight: 186 lb 3.2 oz (84.46 kg)  Physical exam: No apparent distress [place]/AT EOMI No JVD, No carotid bruit RRR S1S2  No wheezing Soft. NT, nondistended No edema. No focal motor or sensory deficits Normal affect  Labs:   Lab Results  Component Value Date   WBC 6.0 06/02/2011   HGB 11.5* 06/02/2011   HCT 35.1* 06/02/2011   MCV 86.7 06/02/2011   PLT 174 06/02/2011   No results for input(s): NA, K, CL, CO2, BUN, CREATININE, CALCIUM, PROT, BILITOT, ALKPHOS, ALT, AST, GLUCOSE in the last 168 hours.  Invalid input(s): LABALBU Lab  Results  Component Value Date   CKTOTAL 79 06/01/2011   CKMB 2.7 06/01/2011   TROPONINI <0.30 06/01/2011   No results found for: CHOL No results found for: HDL No results found for: LDLCALC No results found for: TRIG No results found for: CHOLHDL No results found for: LDLDIRECT    Radiology: EKG: NSR, LAD  ASSESSMENT AND PLAN:  1) fatigue: Unclear whether this is cardiac in origin. Will check exercise treadmill test to evaluate exercise tolerance. He is not really having any chest discomfort. Consider echocardiogram if the symptoms persist. If his treadmill is abnormal, will also have to consider further ischemic testing.  2) hypertension: Well controlled. Continue losartan.  3) prior tobacco abuse. He quit smoking many years ago.  4) he had a question of an ulcer several years ago causing anemia and subsequent syncope. He was told not to use BC powders. For now, hold off on prophylactic  aspirin. Signed:   Mina Marble, MD, [facility] 02/14/2014, 11:54 AM

## 2014-03-02 ENCOUNTER — Telehealth (HOSPITAL_COMMUNITY): Payer: Self-pay

## 2014-03-02 NOTE — Telephone Encounter (Signed)
Encounter complete. 

## 2014-03-04 ENCOUNTER — Ambulatory Visit (HOSPITAL_COMMUNITY)
Admission: RE | Admit: 2014-03-04 | Discharge: 2014-03-04 | Disposition: A | Discharge status: Home / Self Care | Payer: Managed Care, Other (non HMO) | Source: Ambulatory Visit | Attending: Cardiovascular Disease | Admitting: Cardiovascular Disease

## 2014-03-04 DIAGNOSIS — R5383 Other fatigue: Secondary | ICD-10-CM | POA: Insufficient documentation

## 2014-03-04 NOTE — Procedures (Signed)
Exercise Treadmill Test  Pre-Exercise Testing Evaluation Rhythm: normal sinus                  Test  Exercise Tolerance Test Ordering MD: Lendell Caprice  Interpreting MD:   Unique Test No:1 Treadmill:  1  Indication for ETT: fatigue, HTN, Fam Hx CAD  Contraindication to ETT: No   Stress Modality: exercise - treadmill  Cardiac Imaging Performed: non   Protocol: standard Bruce - maximal  Max BP:  169/64  Max MPHR (bpm):  154 85% MPR (bpm):  130  MPHR obtained (bpm):  160 % MPHR obtained: 103  Reached 85% MPHR (min:sec):  6:00 Total Exercise Time (min-sec):  9:00  Workload in METS: 10.10 Borg Scale:   Reason ETT Terminated:  fatigue    ST Segment Analysis At Rest: normal ST segments - no evidence of significant ST depression With Exercise: no evidence of significant ST depression  Other Information Arrhythmia:  No Angina during ETT:  absent (0) Quality of ETT:  diagnostic  ETT Interpretation:  normal - no evidence of ischemia by ST analysis  Comments: Nl GXT  Recommendations: Follow up with Dr. Irish Lack

## 2014-03-08 ENCOUNTER — Encounter: Payer: Self-pay | Admitting: *Deleted

## 2014-03-30 ENCOUNTER — Telehealth: Payer: Self-pay | Admitting: Interventional Cardiology

## 2014-03-30 NOTE — Telephone Encounter (Signed)
I called and spoke with the patient's wife and made her aware of Dr. Hassell Done recommendations. She will discuss this with the patient this evening and call back if he wants to cancel the follow up.

## 2014-03-30 NOTE — Telephone Encounter (Signed)
Follow Up   Pt received a call statin that the exercise tolerance test came back normal. Requests a call back to discuss id follow up appt is needed. Please call

## 2014-03-30 NOTE — Telephone Encounter (Signed)
If his fatigue is better, then he does not have to come back to the office.  If he continues to have easy fatiguability, then we may consider an echo.

## 2014-03-30 NOTE — Telephone Encounter (Signed)
Follow up with Dr. Irish Lack on 3/29- any recommendations prior?

## 2014-04-19 ENCOUNTER — Ambulatory Visit: Payer: Managed Care, Other (non HMO) | Admitting: Interventional Cardiology

## 2014-07-14 DIAGNOSIS — Z23 Encounter for immunization: Secondary | ICD-10-CM | POA: Diagnosis not present

## 2014-07-14 DIAGNOSIS — Z136 Encounter for screening for cardiovascular disorders: Secondary | ICD-10-CM | POA: Diagnosis not present

## 2014-07-14 DIAGNOSIS — I10 Essential (primary) hypertension: Secondary | ICD-10-CM | POA: Diagnosis not present

## 2014-07-14 DIAGNOSIS — Z1159 Encounter for screening for other viral diseases: Secondary | ICD-10-CM | POA: Diagnosis not present

## 2014-07-14 DIAGNOSIS — Z0000 Encounter for general adult medical examination without abnormal findings: Secondary | ICD-10-CM | POA: Diagnosis not present

## 2014-07-14 DIAGNOSIS — Z85038 Personal history of other malignant neoplasm of large intestine: Secondary | ICD-10-CM | POA: Diagnosis not present

## 2014-07-14 DIAGNOSIS — Z125 Encounter for screening for malignant neoplasm of prostate: Secondary | ICD-10-CM | POA: Diagnosis not present

## 2014-07-18 ENCOUNTER — Other Ambulatory Visit: Payer: Self-pay | Admitting: Family Medicine

## 2014-07-18 DIAGNOSIS — Z139 Encounter for screening, unspecified: Secondary | ICD-10-CM

## 2014-08-31 ENCOUNTER — Ambulatory Visit
Admission: RE | Admit: 2014-08-31 | Discharge: 2014-08-31 | Disposition: A | Discharge status: Home / Self Care | Payer: Medicare Other | Source: Ambulatory Visit | Attending: Family Medicine | Admitting: Family Medicine

## 2014-08-31 DIAGNOSIS — Z139 Encounter for screening, unspecified: Secondary | ICD-10-CM

## 2014-08-31 DIAGNOSIS — Z8679 Personal history of other diseases of the circulatory system: Secondary | ICD-10-CM | POA: Diagnosis not present

## 2014-08-31 DIAGNOSIS — Z136 Encounter for screening for cardiovascular disorders: Secondary | ICD-10-CM | POA: Diagnosis not present

## 2014-11-15 DIAGNOSIS — Z23 Encounter for immunization: Secondary | ICD-10-CM | POA: Diagnosis not present

## 2015-01-06 DIAGNOSIS — I10 Essential (primary) hypertension: Secondary | ICD-10-CM | POA: Diagnosis not present

## 2015-08-11 DIAGNOSIS — I10 Essential (primary) hypertension: Secondary | ICD-10-CM | POA: Diagnosis not present

## 2015-08-11 DIAGNOSIS — R05 Cough: Secondary | ICD-10-CM | POA: Diagnosis not present

## 2015-08-11 DIAGNOSIS — Z125 Encounter for screening for malignant neoplasm of prostate: Secondary | ICD-10-CM | POA: Diagnosis not present

## 2015-08-11 DIAGNOSIS — Z85038 Personal history of other malignant neoplasm of large intestine: Secondary | ICD-10-CM | POA: Diagnosis not present

## 2015-08-11 DIAGNOSIS — Z0000 Encounter for general adult medical examination without abnormal findings: Secondary | ICD-10-CM | POA: Diagnosis not present

## 2015-08-11 DIAGNOSIS — Z1389 Encounter for screening for other disorder: Secondary | ICD-10-CM | POA: Diagnosis not present

## 2015-08-18 ENCOUNTER — Ambulatory Visit
Admission: RE | Admit: 2015-08-18 | Discharge: 2015-08-18 | Disposition: A | Discharge status: Home / Self Care | Payer: Self-pay | Source: Ambulatory Visit | Attending: Family Medicine | Admitting: Family Medicine

## 2015-08-18 ENCOUNTER — Other Ambulatory Visit: Payer: Self-pay | Admitting: Family Medicine

## 2015-08-18 DIAGNOSIS — R05 Cough: Secondary | ICD-10-CM

## 2015-08-18 DIAGNOSIS — R059 Cough, unspecified: Secondary | ICD-10-CM

## 2015-11-09 DIAGNOSIS — Z23 Encounter for immunization: Secondary | ICD-10-CM | POA: Diagnosis not present

## 2016-02-15 DIAGNOSIS — Z23 Encounter for immunization: Secondary | ICD-10-CM | POA: Diagnosis not present

## 2016-02-15 DIAGNOSIS — E7800 Pure hypercholesterolemia, unspecified: Secondary | ICD-10-CM | POA: Diagnosis not present

## 2016-02-15 DIAGNOSIS — I10 Essential (primary) hypertension: Secondary | ICD-10-CM | POA: Diagnosis not present

## 2016-08-14 DIAGNOSIS — E7800 Pure hypercholesterolemia, unspecified: Secondary | ICD-10-CM | POA: Diagnosis not present

## 2016-08-14 DIAGNOSIS — I10 Essential (primary) hypertension: Secondary | ICD-10-CM | POA: Diagnosis not present

## 2016-08-14 DIAGNOSIS — Z125 Encounter for screening for malignant neoplasm of prostate: Secondary | ICD-10-CM | POA: Diagnosis not present

## 2016-08-14 DIAGNOSIS — Z1389 Encounter for screening for other disorder: Secondary | ICD-10-CM | POA: Diagnosis not present

## 2016-08-14 DIAGNOSIS — Z85038 Personal history of other malignant neoplasm of large intestine: Secondary | ICD-10-CM | POA: Diagnosis not present

## 2017-01-22 DIAGNOSIS — Z85038 Personal history of other malignant neoplasm of large intestine: Secondary | ICD-10-CM | POA: Diagnosis not present

## 2017-01-22 DIAGNOSIS — Z980 Intestinal bypass and anastomosis status: Secondary | ICD-10-CM | POA: Diagnosis not present

## 2017-01-22 DIAGNOSIS — Z86010 Personal history of colonic polyps: Secondary | ICD-10-CM | POA: Diagnosis not present

## 2017-01-22 DIAGNOSIS — K5730 Diverticulosis of large intestine without perforation or abscess without bleeding: Secondary | ICD-10-CM | POA: Diagnosis not present

## 2017-01-22 DIAGNOSIS — D126 Benign neoplasm of colon, unspecified: Secondary | ICD-10-CM | POA: Diagnosis not present

## 2017-01-28 DIAGNOSIS — D126 Benign neoplasm of colon, unspecified: Secondary | ICD-10-CM | POA: Diagnosis not present

## 2017-02-14 DIAGNOSIS — E7800 Pure hypercholesterolemia, unspecified: Secondary | ICD-10-CM | POA: Diagnosis not present

## 2017-02-14 DIAGNOSIS — G250 Essential tremor: Secondary | ICD-10-CM | POA: Diagnosis not present

## 2017-02-14 DIAGNOSIS — I10 Essential (primary) hypertension: Secondary | ICD-10-CM | POA: Diagnosis not present

## 2017-02-14 DIAGNOSIS — Z85038 Personal history of other malignant neoplasm of large intestine: Secondary | ICD-10-CM | POA: Diagnosis not present

## 2017-05-05 DIAGNOSIS — W57XXXA Bitten or stung by nonvenomous insect and other nonvenomous arthropods, initial encounter: Secondary | ICD-10-CM | POA: Diagnosis not present

## 2017-05-05 DIAGNOSIS — L03313 Cellulitis of chest wall: Secondary | ICD-10-CM | POA: Diagnosis not present

## 2017-05-05 DIAGNOSIS — S20369A Insect bite (nonvenomous) of unspecified front wall of thorax, initial encounter: Secondary | ICD-10-CM | POA: Diagnosis not present

## 2017-05-05 DIAGNOSIS — S30861A Insect bite (nonvenomous) of abdominal wall, initial encounter: Secondary | ICD-10-CM | POA: Diagnosis not present

## 2017-05-05 DIAGNOSIS — L03311 Cellulitis of abdominal wall: Secondary | ICD-10-CM | POA: Diagnosis not present

## 2017-08-12 DIAGNOSIS — E7800 Pure hypercholesterolemia, unspecified: Secondary | ICD-10-CM | POA: Diagnosis not present

## 2017-08-12 DIAGNOSIS — G250 Essential tremor: Secondary | ICD-10-CM | POA: Diagnosis not present

## 2017-08-12 DIAGNOSIS — Z0000 Encounter for general adult medical examination without abnormal findings: Secondary | ICD-10-CM | POA: Diagnosis not present

## 2017-08-12 DIAGNOSIS — Z125 Encounter for screening for malignant neoplasm of prostate: Secondary | ICD-10-CM | POA: Diagnosis not present

## 2017-08-12 DIAGNOSIS — Z85038 Personal history of other malignant neoplasm of large intestine: Secondary | ICD-10-CM | POA: Diagnosis not present

## 2017-08-12 DIAGNOSIS — I10 Essential (primary) hypertension: Secondary | ICD-10-CM | POA: Diagnosis not present

## 2018-02-12 DIAGNOSIS — G250 Essential tremor: Secondary | ICD-10-CM | POA: Diagnosis not present

## 2018-02-12 DIAGNOSIS — I10 Essential (primary) hypertension: Secondary | ICD-10-CM | POA: Diagnosis not present

## 2018-02-12 DIAGNOSIS — E7800 Pure hypercholesterolemia, unspecified: Secondary | ICD-10-CM | POA: Diagnosis not present

## 2018-05-26 DIAGNOSIS — I10 Essential (primary) hypertension: Secondary | ICD-10-CM | POA: Diagnosis not present

## 2018-09-03 DIAGNOSIS — Z85038 Personal history of other malignant neoplasm of large intestine: Secondary | ICD-10-CM | POA: Diagnosis not present

## 2018-09-03 DIAGNOSIS — I10 Essential (primary) hypertension: Secondary | ICD-10-CM | POA: Diagnosis not present

## 2018-09-03 DIAGNOSIS — Z125 Encounter for screening for malignant neoplasm of prostate: Secondary | ICD-10-CM | POA: Diagnosis not present

## 2018-09-03 DIAGNOSIS — E7800 Pure hypercholesterolemia, unspecified: Secondary | ICD-10-CM | POA: Diagnosis not present

## 2018-09-03 DIAGNOSIS — Z0000 Encounter for general adult medical examination without abnormal findings: Secondary | ICD-10-CM | POA: Diagnosis not present

## 2018-09-03 DIAGNOSIS — G250 Essential tremor: Secondary | ICD-10-CM | POA: Diagnosis not present

## 2018-09-08 DIAGNOSIS — I10 Essential (primary) hypertension: Secondary | ICD-10-CM | POA: Diagnosis not present

## 2018-09-08 DIAGNOSIS — Z125 Encounter for screening for malignant neoplasm of prostate: Secondary | ICD-10-CM | POA: Diagnosis not present

## 2018-09-08 DIAGNOSIS — E7800 Pure hypercholesterolemia, unspecified: Secondary | ICD-10-CM | POA: Diagnosis not present

## 2018-09-15 DIAGNOSIS — H6123 Impacted cerumen, bilateral: Secondary | ICD-10-CM | POA: Diagnosis not present

## 2018-09-15 DIAGNOSIS — H903 Sensorineural hearing loss, bilateral: Secondary | ICD-10-CM | POA: Diagnosis not present

## 2018-10-07 DIAGNOSIS — H903 Sensorineural hearing loss, bilateral: Secondary | ICD-10-CM | POA: Diagnosis not present

## 2018-11-25 DIAGNOSIS — Z23 Encounter for immunization: Secondary | ICD-10-CM | POA: Diagnosis not present

## 2019-03-08 DIAGNOSIS — E7800 Pure hypercholesterolemia, unspecified: Secondary | ICD-10-CM | POA: Diagnosis not present

## 2019-03-08 DIAGNOSIS — I10 Essential (primary) hypertension: Secondary | ICD-10-CM | POA: Diagnosis not present

## 2019-03-08 DIAGNOSIS — N529 Male erectile dysfunction, unspecified: Secondary | ICD-10-CM | POA: Diagnosis not present

## 2019-09-08 DIAGNOSIS — N529 Male erectile dysfunction, unspecified: Secondary | ICD-10-CM | POA: Diagnosis not present

## 2019-09-08 DIAGNOSIS — Z0000 Encounter for general adult medical examination without abnormal findings: Secondary | ICD-10-CM | POA: Diagnosis not present

## 2019-09-08 DIAGNOSIS — Z125 Encounter for screening for malignant neoplasm of prostate: Secondary | ICD-10-CM | POA: Diagnosis not present

## 2019-09-08 DIAGNOSIS — I10 Essential (primary) hypertension: Secondary | ICD-10-CM | POA: Diagnosis not present

## 2019-09-08 DIAGNOSIS — B354 Tinea corporis: Secondary | ICD-10-CM | POA: Diagnosis not present

## 2019-09-08 DIAGNOSIS — G250 Essential tremor: Secondary | ICD-10-CM | POA: Diagnosis not present

## 2019-09-08 DIAGNOSIS — N1831 Chronic kidney disease, stage 3a: Secondary | ICD-10-CM | POA: Diagnosis not present

## 2019-09-08 DIAGNOSIS — Z85038 Personal history of other malignant neoplasm of large intestine: Secondary | ICD-10-CM | POA: Diagnosis not present

## 2019-09-08 DIAGNOSIS — E7800 Pure hypercholesterolemia, unspecified: Secondary | ICD-10-CM | POA: Diagnosis not present

## 2020-01-27 DIAGNOSIS — Z03818 Encounter for observation for suspected exposure to other biological agents ruled out: Secondary | ICD-10-CM | POA: Diagnosis not present

## 2020-01-27 DIAGNOSIS — B349 Viral infection, unspecified: Secondary | ICD-10-CM | POA: Diagnosis not present

## 2020-01-27 DIAGNOSIS — Z20822 Contact with and (suspected) exposure to covid-19: Secondary | ICD-10-CM | POA: Diagnosis not present

## 2020-01-27 DIAGNOSIS — R059 Cough, unspecified: Secondary | ICD-10-CM | POA: Diagnosis not present

## 2020-01-27 DIAGNOSIS — R5383 Other fatigue: Secondary | ICD-10-CM | POA: Diagnosis not present

## 2020-03-10 DIAGNOSIS — N1831 Chronic kidney disease, stage 3a: Secondary | ICD-10-CM | POA: Diagnosis not present

## 2020-03-10 DIAGNOSIS — E7800 Pure hypercholesterolemia, unspecified: Secondary | ICD-10-CM | POA: Diagnosis not present

## 2020-03-10 DIAGNOSIS — K219 Gastro-esophageal reflux disease without esophagitis: Secondary | ICD-10-CM | POA: Diagnosis not present

## 2020-03-10 DIAGNOSIS — I10 Essential (primary) hypertension: Secondary | ICD-10-CM | POA: Diagnosis not present

## 2020-03-10 DIAGNOSIS — R059 Cough, unspecified: Secondary | ICD-10-CM | POA: Diagnosis not present

## 2020-07-13 DIAGNOSIS — R55 Syncope and collapse: Secondary | ICD-10-CM | POA: Diagnosis not present

## 2020-07-13 DIAGNOSIS — E7800 Pure hypercholesterolemia, unspecified: Secondary | ICD-10-CM | POA: Diagnosis not present

## 2020-07-13 DIAGNOSIS — N1831 Chronic kidney disease, stage 3a: Secondary | ICD-10-CM | POA: Diagnosis not present

## 2020-07-13 DIAGNOSIS — I10 Essential (primary) hypertension: Secondary | ICD-10-CM | POA: Diagnosis not present

## 2020-07-17 NOTE — Progress Notes (Signed)
Cardiology Office Note:    Date:  07/18/2020   ID:  [name], DOB Mar 24, 1945, MRN [id number]  PCP:  Shirline Frees, MD   [facility] Providers Cardiologist:  Werner Lean, MD    Formerly [place]  Referring MD: Shirline Frees, MD   CC: Syncope Consulted for the evaluation of syncope at the behest of Shirline Frees, MD  History of Present Illness:    [name] is a 75 y.o. male with a hx of HTN, Aortic atherosclerosis Anemia NOS, who presents for evaluation 07/18/20.  Oncological History notable for: Malignancies: Stage III Colon Cancer (proximal Sigmoid Colon 2008) Surgery: Colectomy  Chemotherapy: N/A Cessations for Toxicity: N/A Radiation: None in cardiac field Oncology care spearheaded by: Dr. Earlie Server Formerly  Patient notes that he is feeling fine.  Wife notes that they went on a seven day cruise to [place].  Patient was trying to keep up with the crew member pushing his flight.  Had coffee and hadn't eaten anything that morning (except small amount of cheese). During the flight, patient felt hot and diaphoresis on the plane.  He felt nauseous and passed out.  Check his oxygen and was found to have hypoxia.  BP 141/87 at that time.  Was non responsive and diaphoretic and received oxygen and slowly came around.  Got some food and felt better.  Saw EMS afterward, and was looking OK and had no further interventions.  Has had no chest pain, chest pressure, chest tightness, chest stinging. Patient exertion notable for pushing wife in a wheelchair, and feels bilateral arm pain. This felt worse the next day.  No shortness of breath, DOE  with significant wheelchair pushing.  No PND or orthopnea.  No bendopnea, weight gain, leg swelling , or abdominal swelling. Notes no palpitations or funny heart beats.     Prior Syncopal work up in 2016 was largely benign.  This feels similar to prior.  Past Medical History:  Diagnosis Date   colon ca dx'd 05/2006   surg only    Essential tremor    History of colonic polyps    Hyperlipidemia    Hypertension    Liver disease    Migraine headache    Personal history of other malignant neoplasm of large intestine    Polio    PUD (peptic ulcer disease)    Pure hypercholesterolemia    Rectal bleed    Stage 3a chronic kidney disease ([place])     Past Surgical History:  Procedure Laterality Date   COLON SURGERY     ESOPHAGOGASTRODUODENOSCOPY  06/02/2011   Procedure: ESOPHAGOGASTRODUODENOSCOPY (EGD);  Surgeon: Missy Sabins, MD;  Location: [facility] ENDOSCOPY;  Service: Endoscopy;  Laterality: N/A;    Current Medications: Current Meds  Medication Sig   losartan (COZAAR) 100 MG tablet Take 100 mg by mouth daily.   pantoprazole (PROTONIX) 40 MG tablet Take 40 mg by mouth as needed.     Allergies:   Lisinopril and Omeprazole   Social History   Socioeconomic History   Marital status: Married    Spouse name: Not on file   Number of children: Not on file   Years of education: Not on file   Highest education level: Not on file  Occupational History   Not on file  Tobacco Use   Smoking status: Former    Pack years: 0.00   Smokeless tobacco: Never   Tobacco comments:    [place]  Substance and Sexual Activity  Alcohol use: No    Comment: occassional   Drug use: No   Sexual activity: Not Currently  Other Topics Concern   Not on file  Social History Narrative   Not on file   Social Determinants of Health   Financial Resource Strain: Not on file  Food Insecurity: Not on file  Transportation Needs: Not on file  Physical Activity: Not on file  Stress: Not on file  Social Connections: Not on file    Social: Lives with wife and a daughter who is a Marine scientist and son in law who is a Marine scientist  Family History: The patient's family history includes Heart disease in his father and mother; Lung cancer in his mother; Prostate cancer in his father. Had 9 siblings; some of whom had CAD NOS.  ROS:   Please  see the history of present illness.     All other systems reviewed and are negative.  EKGs/Labs/Other Studies Reviewed:    The following studies were reviewed today:  EKG:  EKG is  ordered today.  The ekg ordered today demonstrates  07/11/20: SR 1st HB and LAFB Borderline inferior infarct pattern 07/18/20:  SR 1st HB   Transthoracic Echocardiogram: Date: 06/01/2011 Results: Left ventricle: The cavity size was normal. Wall thickness  was normal. Systolic function was normal. The estimated  ejection fraction was in the range of 60% to 65%.    Echocardiography.  Chest CT: Date:06/25/2011 Results: Descending Aortic Atherosclerosis 1. No acute process or evidence of metastatic disease in the chest.  2.  The perifissural nodule along the right minor fissure is  similar to 2008, consistent with a benign etiology.  Most likely a  subpleural lymph node.  3. Mild centrilobular emphysema.  ECG Stress Testing : Date: 03/04/2014 Results: ST Segment Analysis  At Rest: normal ST segments - no evidence of significant ST  depression  With Exercise: no evidence of significant ST depression   Recent Labs: No results found for requested labs within last 8760 hours.  Recent Lipid Panel No results found for: CHOL, TRIG, HDL, CHOLHDL, VLDL, LDLCALC, LDLDIRECT   Risk Assessment/Calculations:     N/A     Physical Exam:    VS:  BP 140/70   Pulse 69   Ht 5\' 6"  (1.676 m)   Wt 91.6 kg   SpO2 99%   BMI 32.60 kg/m     Wt Readings from Last 3 Encounters:  07/18/20 91.6 kg  02/14/14 84.5 kg  06/26/11 83.4 kg    GEN:  Well nourished, well developed in no acute distress HEENT: Frank's Sign NECK: No JVD; No carotid bruits LYMPHATICS: No lymphadenopathy CARDIAC: RRR, no murmurs, rubs, gallops RESPIRATORY:  Clear to auscultation without rales, wheezing or rhonchi  ABDOMEN: Soft, non-tender, non-distended MUSCULOSKELETAL:  No edema; No deformity  SKIN: Warm and dry NEUROLOGIC:  Alert and  oriented x 3 PSYCHIATRIC:  Normal affect   ASSESSMENT:    1. Syncope and collapse   2. Essential hypertension   3. Aortic atherosclerosis ([place])   4. Hx of colon cancer, stage III    PLAN:    Syncope vs Near syncope HTN Aortic Atherosclerosis History of colonic malignancy - Would recommend exercise nuclear medicine stress test (NPO at midnight); discussed risks, benefits, and alternatives of the diagnostic procedure including chest pain, arrhythmia, and death.  Patient amenable for testing. - looking for exercise induced rhythm issues, heart structure as well - based on discussion likely this was a vaso-vagal syncope and discussed prevention  factors - will start rosvuastatin 5 mg PO Daily and check lipids at next visit  Patients with unexplained syncope are advised to have a 6 month restriction for private driving unless a clear non-cardiac sequale can be noted.  Three months follow up unless new symptoms or abnormal test results warranting change in plan Would be reasonable for  APP Follow up    Shared Decision Making/Informed Consent The risks [chest pain, shortness of breath, cardiac arrhythmias, dizziness, blood pressure fluctuations, myocardial infarction, stroke/transient ischemic attack, nausea, vomiting, allergic reaction, radiation exposure, metallic taste sensation and life-threatening complications (estimated to be 1 in 10,000)], benefits (risk stratification, diagnosing coronary artery disease, treatment guidance) and alternatives of a nuclear stress test were discussed in detail with Mr. Bruschi and he agrees to proceed.    Medication Adjustments/Labs and Tests Ordered: Current medicines are reviewed at length with the patient today.  Concerns regarding medicines are outlined above.  No orders of the defined types were placed in this encounter.  No orders of the defined types were placed in this encounter.   There are no Patient Instructions on file for this visit.    Signed, Werner Lean, MD  07/18/2020 10:31 AM    [facility]

## 2020-07-18 ENCOUNTER — Ambulatory Visit: Payer: PPO | Admitting: Internal Medicine

## 2020-07-18 ENCOUNTER — Other Ambulatory Visit: Payer: Self-pay

## 2020-07-18 ENCOUNTER — Encounter: Payer: Self-pay | Admitting: Internal Medicine

## 2020-07-18 VITALS — BP 140/70 | HR 69 | Ht 66.0 in | Wt 202.0 lb

## 2020-07-18 DIAGNOSIS — I10 Essential (primary) hypertension: Secondary | ICD-10-CM

## 2020-07-18 DIAGNOSIS — I700 Atherosclerosis of aorta: Secondary | ICD-10-CM

## 2020-07-18 DIAGNOSIS — R55 Syncope and collapse: Secondary | ICD-10-CM

## 2020-07-18 DIAGNOSIS — Z85038 Personal history of other malignant neoplasm of large intestine: Secondary | ICD-10-CM

## 2020-07-18 MED ORDER — ROSUVASTATIN CALCIUM 5 MG PO TABS: 5.0000 mg | ORAL_TABLET | Freq: Every day | ORAL | 3 refills | Status: DC

## 2020-07-18 NOTE — Patient Instructions (Signed)
Medication Instructions:  Your physician has recommended you make the following change in your medication: START:  rosuvastatin (Crestor) 5 mg by mouth daily at bedtime  *If you need a refill on your cardiac medications before your next appointment, please call your pharmacy*   Lab Work: IN 3 MONTHS: Liver function test and Fasting Lipid Panel If you have labs (blood work) drawn today and your tests are completely normal, you will receive your results only by: MyChart Message (if you have MyChart) OR A paper copy in the mail If you have any lab test that is abnormal or we need to change your treatment, we will call you to review the results.   Testing/Procedures: Your physician has requested that you have en exercise stress myoview. For further information please visit [email or web address]. Please follow instruction sheet, as given.    Follow-Up: At [facility], you and your health needs are our priority.  As part of our continuing mission to provide you with exceptional heart care, we have created designated Provider Care Teams.  These Care Teams include your primary Cardiologist (physician) and Advanced Practice Providers (APPs -  Physician Assistants and Nurse Practitioners) who all work together to provide you with the care you need, when you need it.  We recommend signing up for the patient portal called "MyChart".  Sign up information is provided on this After Visit Summary.  MyChart is used to connect with patients for Virtual Visits (Telemedicine).  Patients are able to view lab/test results, encounter notes, upcoming appointments, etc.  Non-urgent messages can be sent to your provider as well.   To learn more about what you can do with MyChart, go to [email or web address].    Your next appointment:   3 month(s)  The format for your next appointment:   In Person  Provider:   You may see Werner Lean, MD or one of the following Advanced Practice Providers on your  designated Care Team:   Melina Copa, PA-C Ermalinda Barrios, PA-C

## 2020-07-26 ENCOUNTER — Telehealth: Payer: Self-pay

## 2020-07-26 NOTE — Telephone Encounter (Signed)
Detailed instructions left on the patient's answering machine. Asked to call back with any questions. S.Jaelie Aguilera EMTP 

## 2020-08-01 ENCOUNTER — Other Ambulatory Visit: Payer: Self-pay

## 2020-08-01 ENCOUNTER — Ambulatory Visit (HOSPITAL_COMMUNITY): Payer: PPO | Attending: Cardiovascular Disease

## 2020-08-01 DIAGNOSIS — R55 Syncope and collapse: Secondary | ICD-10-CM | POA: Insufficient documentation

## 2020-08-01 LAB — MYOCARDIAL PERFUSION IMAGING
Estimated workload: 4.6 METS
Exercise duration (min): 4 min
Exercise duration (sec): 1 s
LV dias vol: 62 mL (ref 62–150)
LV sys vol: 20 mL
MPHR: 148 {beats}/min
Peak HR: 144 {beats}/min
Percent HR: 97 %
Rest HR: 68 {beats}/min
SDS: 0
SRS: 0
SSS: 0
TID: 1.12

## 2020-08-01 MED ORDER — TECHNETIUM TC 99M TETROFOSMIN IV KIT
10.7000 | PACK | Freq: Once | INTRAVENOUS | Status: AC | PRN
Start: 2020-08-01 — End: 2020-08-01
  Administered 2020-08-01: 10.7 via INTRAVENOUS
  Filled 2020-08-01: qty 11

## 2020-08-01 MED ORDER — TECHNETIUM TC 99M TETROFOSMIN IV KIT
32.2000 | PACK | Freq: Once | INTRAVENOUS | Status: AC | PRN
Start: 2020-08-01 — End: 2020-08-01
  Administered 2020-08-01: 32.2 via INTRAVENOUS
  Filled 2020-08-01: qty 33

## 2020-09-14 DIAGNOSIS — N529 Male erectile dysfunction, unspecified: Secondary | ICD-10-CM | POA: Diagnosis not present

## 2020-09-14 DIAGNOSIS — K219 Gastro-esophageal reflux disease without esophagitis: Secondary | ICD-10-CM | POA: Diagnosis not present

## 2020-09-14 DIAGNOSIS — Z0000 Encounter for general adult medical examination without abnormal findings: Secondary | ICD-10-CM | POA: Diagnosis not present

## 2020-09-14 DIAGNOSIS — Z85038 Personal history of other malignant neoplasm of large intestine: Secondary | ICD-10-CM | POA: Diagnosis not present

## 2020-09-14 DIAGNOSIS — G250 Essential tremor: Secondary | ICD-10-CM | POA: Diagnosis not present

## 2020-09-14 DIAGNOSIS — I10 Essential (primary) hypertension: Secondary | ICD-10-CM | POA: Diagnosis not present

## 2020-09-14 DIAGNOSIS — E7800 Pure hypercholesterolemia, unspecified: Secondary | ICD-10-CM | POA: Diagnosis not present

## 2020-10-16 ENCOUNTER — Other Ambulatory Visit: Payer: Self-pay

## 2020-10-16 ENCOUNTER — Other Ambulatory Visit: Payer: PPO | Admitting: *Deleted

## 2020-10-16 DIAGNOSIS — I700 Atherosclerosis of aorta: Secondary | ICD-10-CM

## 2020-10-16 LAB — LIPID PANEL
Chol/HDL Ratio: 2.7 ratio (ref 0.0–5.0)
Cholesterol, Total: 146 mg/dL (ref 100–199)
HDL: 54 mg/dL (ref >39)
LDL Chol Calc (NIH): 79 mg/dL (ref 0–99)
Triglycerides: 67 mg/dL (ref 0–149)
VLDL Cholesterol Cal: 13 mg/dL (ref 5–40)

## 2020-10-16 LAB — HEPATIC FUNCTION PANEL
ALT: 19 IU/L (ref 0–44)
AST: 23 IU/L (ref 0–40)
Albumin: 3.9 g/dL (ref 3.7–4.7)
Alkaline Phosphatase: 66 IU/L (ref 44–121)
Bilirubin Total: 0.5 mg/dL (ref 0.0–1.2)
Bilirubin, Direct: 0.12 mg/dL (ref 0.00–0.40)
Total Protein: 6.5 g/dL (ref 6.0–8.5)

## 2020-10-19 ENCOUNTER — Ambulatory Visit: Payer: PPO | Admitting: Internal Medicine

## 2020-10-20 ENCOUNTER — Telehealth: Payer: Self-pay

## 2020-10-20 DIAGNOSIS — I700 Atherosclerosis of aorta: Secondary | ICD-10-CM

## 2020-10-20 MED ORDER — ROSUVASTATIN CALCIUM 10 MG PO TABS: 10.0000 mg | ORAL_TABLET | Freq: Every day | ORAL | 3 refills | Status: DC

## 2020-10-20 NOTE — Telephone Encounter (Signed)
-----   Message from Werner Lean, MD sent at 10/20/2020  3:09 PM EDT ----- Results: LDL slightly above goal Plan: Rosuvastatin 10 mg PO daily and labs in three months  Werner Lean, MD

## 2020-10-20 NOTE — Telephone Encounter (Signed)
Called and reviewed results and MD recommendations with wife ok per DPR.  She is agreeable to plan of care.  All questions answered.  Orders placed and lab appointment made for 01/18/21.

## 2020-11-30 DIAGNOSIS — Z23 Encounter for immunization: Secondary | ICD-10-CM | POA: Diagnosis not present

## 2020-12-07 NOTE — Progress Notes (Signed)
Cardiology Office Note:    Date:  12/08/2020   ID:  [name], DOB 1945/07/03, MRN [id number]  PCP:  Shirline Frees, MD   [facility] Prov[facility] Cardiologist:  Werner Lean, MD    Referring MD: Shirline Frees, MD   CC: Syncope follow up.  History of Present Illness:    [name] is a 75 y.o. male with a hx of HTN, Aortic atherosclerosis Anemia NOS, Colon cancer with no cardiotoxic chemo or radiation who presents for evaluation 07/18/20 for hypoxia on a plan and syncope.  In interim of this visit, patient had negative stress test and started statin.  Seen 12/08/20.  Patient notes that he is doing fine.   Since last visit notes that he is tired but otherwise feeling well   Wife notes that he was feeling shaky similar to the past and had his blood sugar.  They now keep snacks around There are no interval hospital/ED visit.    No chest pain or pressure .  No SOB/DOE and no PND/Orthopnea.  No weight gain or leg swelling.  No palpitations or syncope .  Past Medical History:  Diagnosis Date   colon ca dx'd 05/2006   surg only   Essential tremor    History of colonic polyps    Hyperlipidemia    Hypertension    Liver disease    Migraine headache    Personal history of other malignant neoplasm of large intestine    Polio    PUD (peptic ulcer disease)    Pure hypercholesterolemia    Rectal bleed    Stage 3a chronic kidney disease ([place])     Past Surgical History:  Procedure Laterality Date   COLON SURGERY     ESOPHAGOGASTRODUODENOSCOPY  06/02/2011   Procedure: ESOPHAGOGASTRODUODENOSCOPY (EGD);  Surgeon: Missy Sabins, MD;  Location[facility];  Service: Endoscopy;  Laterality: N/A;    Current Medications: Current Meds  Medication Sig   losartan (COZAAR) 100 MG tablet Take 100 mg by mouth daily.   pantoprazole (PROTONIX) 40 MG tablet Take 40 mg by mouth as needed.   rosuvastatin (CRESTOR) 10 MG tablet Take 1 tablet (10 mg total) by mouth daily.    sildenafil (VIAGRA) 100 MG tablet Take 100 mg by mouth as needed.     Allergies:   Lisinopril and Omeprazole   Social History   Socioeconomic History   Marital status: Married    Spouse name: Not on file   Number of children: Not on file   Years of education: Not on file   Highest education level: Not on file  Occupational History   Not on file  Tobacco Use   Smoking status: Former   Smokeless tobacco: Never   Tobacco comments:    QUIT SMOKING IN 16  Substance and Sexual Activity   Alcohol use: No    Comment: occassional   Drug use: No   Sexual activity: Not Currently  Other Topics Concern   Not on file  Social History Narrative   Not on file   Social Determinants of Health   Financial Resource Strain: Not on file  Food Insecurity: Not on file  Transportation Needs: Not on file  Physical Activity: Not on file  Stress: Not on file  Social Connections: Not on file    Social: Lives with wife and a daughter who is a Marine scientist and son in law who is a Marine scientist  Family History: The patient's family history includes Heart disease in his  father and mother; Lung cancer in his mother; Prostate cancer in his father. Had 9 siblings; some of whom had CAD NOS.  ROS:   Please see the history of present illness.    All other systems reviewed and are negative.  EKGs/Labs/Other Studies Reviewed:    The following studies were reviewed today:  EKG:  EKG is  ordered today.  The ekg ordered today demonstrates  07/11/20: SR 1st HB and LAFB Borderline inferior infarct pattern 07/18/20:  SR 1st HB   Transthoracic Echocardiogram: Date: 06/01/2011 Results: Left ventricle: The cavity size was normal. Wall thickness  was normal. Systolic function was normal. The estimated  ejection fraction was in the range of 60% to 65%.    Echocardiography.  Chest CT: Date:06/25/2011 Results: Descending Aortic Atherosclerosis 1. No acute process or evidence of metastatic disease in the chest.  2.  The  perifissural nodule along the right minor fissure is  similar to 2008, consistent with a benign etiology.  Most likely a  subpleural lymph node.  3. Mild centrilobular emphysema.  ECG Stress Testing : Date: 03/04/2014 Results: ST Segment Analysis  At Rest: normal ST segments - no evidence of significant ST  depression  With Exercise: no evidence of significant ST depression   Date: 12/07/2020 Results: The left ventricular ejection fraction is hyperdynamic (>65%). Nuclear stress EF: 68%. Blood pressure demonstrated a normal response to exercise. There was no ST segment deviation noted during stress. The study is normal. This is a low risk study.   Normal stress nuclear study with no ischemia or infarction.  Gated ejection fraction 68% with normal wall motion.    Recent Labs: 10/16/2020: ALT 19  Recent Lipid Panel    Component Value Date/Time   CHOL 146 10/16/2020 0912   TRIG 67 10/16/2020 0912   HDL 54 10/16/2020 0912   CHOLHDL 2.7 10/16/2020 0912   LDLCALC 79 10/16/2020 0912    Physical Exam:    VS:  BP 132/78   Pulse 76   Ht 5\' 6"  (1.676 m)   Wt 208 lb (94.3 kg)   SpO2 99%   BMI 33.57 kg/m     Wt Readings from Last 3 Encounters:  12/08/20 208 lb (94.3 kg)  08/01/20 202 lb (91.6 kg)  07/18/20 202 lb (91.6 kg)    Gen: No distress   Neck: No JVD Cardiac: No Rubs or Gallops, no murmur, normal rhythm, +2 radial pulses Respiratory: Clear to auscultation bilaterally, normal effort, normal  respiratory rate GI: Soft, nontender, non-distended  MS: No  edema;  moves all extremities Integument: Skin feels warm Neuro:  At time of evaluation, alert and oriented to person/place/time/situation  Psych: Normal affect, patient feels well   ASSESSMENT:    1. Aortic atherosclerosis ([place])   2. Mixed hyperlipidemia   3. Essential hypertension     PLAN:    Aortic Atherosclerosis Mixed Hyperlipidemia Near syncope- suspect hypoglycemia related HTN History of colonic  malignancy - continue losartan - continue rosuvastatin 10; will check lipids and ALT based on increased dose from last - one year follow up with me or APP (me preferred) in one year    Medication Adjustments/Labs and Tests Ordered: Current medicines are reviewed at length with the patient today.  Concerns regarding medicines are outlined above.  Orders Placed This Encounter  Procedures   Lipid panel   ALT    No orders of the defined types were placed in this encounter.   Patient Instructions  Medication Instructions:  Your  physician recommends that you continue on your current medications as directed. Please refer to the Current Medication list given to you today.  *If you need a refill on your cardiac medications before your next appointment, please call your pharmacy*   Lab Work: TODAY: FLP, ALT If you have labs (blood work) drawn today and your tests are completely normal, you will receive your results only by: [place] (if you have MyChart) OR A paper copy in the mail If you have any lab test that is abnormal or we need to change your treatment, we will call you to review the results.   Testing/Procedures: NONE   Follow-Up: At [facility], you and your health needs are our priority.  As part of our continuing mission to provide you with exceptional heart care, we have created designated Provider Care Teams.  These Care Teams include your primary Cardiologist (physician) and Advanced Practice Providers (APPs -  Physician Assistants and Nurse Practitioners) who all work together to provide you with the care you need, when you need it.   Your next appointment:   1 year(s)  The format for your next appointment:   In Person  Provider:   Werner Lean, MD  or Melina Copa, PA-C or Ermalinda Barrios, PA-C       :1}     Signed, Werner Lean, MD  12/08/2020 8:32 AM    [place]

## 2020-12-08 ENCOUNTER — Encounter: Payer: Self-pay | Admitting: Internal Medicine

## 2020-12-08 ENCOUNTER — Other Ambulatory Visit: Payer: Self-pay

## 2020-12-08 ENCOUNTER — Ambulatory Visit: Payer: PPO | Admitting: Internal Medicine

## 2020-12-08 VITALS — BP 132/78 | HR 76 | Ht 66.0 in | Wt 208.0 lb

## 2020-12-08 DIAGNOSIS — E782 Mixed hyperlipidemia: Secondary | ICD-10-CM | POA: Insufficient documentation

## 2020-12-08 DIAGNOSIS — I10 Essential (primary) hypertension: Secondary | ICD-10-CM

## 2020-12-08 DIAGNOSIS — I700 Atherosclerosis of aorta: Secondary | ICD-10-CM | POA: Diagnosis not present

## 2020-12-08 LAB — LIPID PANEL
Chol/HDL Ratio: 3.1 ratio (ref 0.0–5.0)
Cholesterol, Total: 153 mg/dL (ref 100–199)
HDL: 50 mg/dL (ref >39)
LDL Chol Calc (NIH): 88 mg/dL (ref 0–99)
Triglycerides: 79 mg/dL (ref 0–149)
VLDL Cholesterol Cal: 15 mg/dL (ref 5–40)

## 2020-12-08 LAB — ALT: ALT: 17 IU/L (ref 0–44)

## 2020-12-08 NOTE — Patient Instructions (Signed)
Medication Instructions:  Your physician recommends that you continue on your current medications as directed. Please refer to the Current Medication list given to you today.  *If you need a refill on your cardiac medications before your next appointment, please call your pharmacy*   Lab Work: TODAY: FLP, ALT If you have labs (blood work) drawn today and your tests are completely normal, you will receive your results only by: [place] (if you have MyChart) OR A paper copy in the mail If you have any lab test that is abnormal or we need to change your treatment, we will call you to review the results.   Testing/Procedures: NONE   Follow-Up: At [facility], you and your health needs are our priority.  As part of our continuing mission to provide you with exceptional heart care, we have created designated Provider Care Teams.  These Care Teams include your primary Cardiologist (physician) and Advanced Practice Providers (APPs -  Physician Assistants and Nurse Practitioners) who all work together to provide you with the care you need, when you need it.   Your next appointment:   1 year(s)  The format for your next appointment:   In Person  Provider:   Werner Lean, MD  or Melina Copa, PA-C or Ermalinda Barrios, PA-C       :1}

## 2020-12-11 ENCOUNTER — Telehealth: Payer: Self-pay | Admitting: Internal Medicine

## 2020-12-11 DIAGNOSIS — E782 Mixed hyperlipidemia: Secondary | ICD-10-CM

## 2020-12-11 DIAGNOSIS — I700 Atherosclerosis of aorta: Secondary | ICD-10-CM

## 2020-12-11 MED ORDER — ROSUVASTATIN CALCIUM 20 MG PO TABS: 20.0000 mg | ORAL_TABLET | Freq: Every day | ORAL | 3 refills | Status: DC

## 2020-12-11 NOTE — Telephone Encounter (Signed)
Reviewed results and MD recommendations with pt spouse.  (Ok per [facility])  Spouse agreeable to plan orders placed and lab appointment scheduled for 03/12/20 advised spouse to have pt fast 8-12 hours prior.

## 2020-12-11 NOTE — Telephone Encounter (Signed)
Patient's wife called and wanted to know if Dr. Gasper Sells wants him to increase his LDL medication from 10 mg to 20 mg. Please call to verify

## 2020-12-11 NOTE — Telephone Encounter (Signed)
-----   Message from Werner Lean, MD sent at 12/11/2020  1:12 PM EST ----- Results: LDL has increased Plan: Statin increase (10-> 20) and labs in three months  Werner Lean, MD

## 2021-01-18 ENCOUNTER — Other Ambulatory Visit: Payer: PPO

## 2021-03-07 DIAGNOSIS — I10 Essential (primary) hypertension: Secondary | ICD-10-CM | POA: Diagnosis not present

## 2021-03-07 DIAGNOSIS — G250 Essential tremor: Secondary | ICD-10-CM | POA: Diagnosis not present

## 2021-03-07 DIAGNOSIS — Z125 Encounter for screening for malignant neoplasm of prostate: Secondary | ICD-10-CM | POA: Diagnosis not present

## 2021-03-07 DIAGNOSIS — E7800 Pure hypercholesterolemia, unspecified: Secondary | ICD-10-CM | POA: Diagnosis not present

## 2021-03-12 ENCOUNTER — Other Ambulatory Visit: Payer: PPO | Admitting: *Deleted

## 2021-03-12 ENCOUNTER — Other Ambulatory Visit: Payer: Self-pay

## 2021-03-12 DIAGNOSIS — E782 Mixed hyperlipidemia: Secondary | ICD-10-CM | POA: Diagnosis not present

## 2021-03-12 DIAGNOSIS — I700 Atherosclerosis of aorta: Secondary | ICD-10-CM

## 2021-03-12 LAB — LIPID PANEL
Chol/HDL Ratio: 2.4 ratio (ref 0.0–5.0)
Cholesterol, Total: 137 mg/dL (ref 100–199)
HDL: 57 mg/dL (ref >39)
LDL Chol Calc (NIH): 66 mg/dL (ref 0–99)
Triglycerides: 68 mg/dL (ref 0–149)
VLDL Cholesterol Cal: 14 mg/dL (ref 5–40)

## 2021-03-12 LAB — ALT: ALT: 14 IU/L (ref 0–44)

## 2021-06-13 DIAGNOSIS — H0231 Blepharochalasis right upper eyelid: Secondary | ICD-10-CM | POA: Diagnosis not present

## 2021-06-13 DIAGNOSIS — H353131 Nonexudative age-related macular degeneration, bilateral, early dry stage: Secondary | ICD-10-CM | POA: Diagnosis not present

## 2021-06-13 DIAGNOSIS — H2513 Age-related nuclear cataract, bilateral: Secondary | ICD-10-CM | POA: Diagnosis not present

## 2021-06-13 DIAGNOSIS — H0234 Blepharochalasis left upper eyelid: Secondary | ICD-10-CM | POA: Diagnosis not present

## 2021-08-07 DIAGNOSIS — J069 Acute upper respiratory infection, unspecified: Secondary | ICD-10-CM | POA: Diagnosis not present

## 2021-08-07 DIAGNOSIS — Z03818 Encounter for observation for suspected exposure to other biological agents ruled out: Secondary | ICD-10-CM | POA: Diagnosis not present

## 2021-09-27 DIAGNOSIS — E7800 Pure hypercholesterolemia, unspecified: Secondary | ICD-10-CM | POA: Diagnosis not present

## 2021-09-27 DIAGNOSIS — Z85038 Personal history of other malignant neoplasm of large intestine: Secondary | ICD-10-CM | POA: Diagnosis not present

## 2021-09-27 DIAGNOSIS — Z0000 Encounter for general adult medical examination without abnormal findings: Secondary | ICD-10-CM | POA: Diagnosis not present

## 2021-09-27 DIAGNOSIS — Z23 Encounter for immunization: Secondary | ICD-10-CM | POA: Diagnosis not present

## 2021-09-27 DIAGNOSIS — G250 Essential tremor: Secondary | ICD-10-CM | POA: Diagnosis not present

## 2021-09-27 DIAGNOSIS — I10 Essential (primary) hypertension: Secondary | ICD-10-CM | POA: Diagnosis not present

## 2021-12-17 DIAGNOSIS — D122 Benign neoplasm of ascending colon: Secondary | ICD-10-CM | POA: Diagnosis not present

## 2021-12-17 DIAGNOSIS — K649 Unspecified hemorrhoids: Secondary | ICD-10-CM | POA: Diagnosis not present

## 2021-12-17 DIAGNOSIS — Z85038 Personal history of other malignant neoplasm of large intestine: Secondary | ICD-10-CM | POA: Diagnosis not present

## 2021-12-17 DIAGNOSIS — Z980 Intestinal bypass and anastomosis status: Secondary | ICD-10-CM | POA: Diagnosis not present

## 2021-12-17 DIAGNOSIS — K5730 Diverticulosis of large intestine without perforation or abscess without bleeding: Secondary | ICD-10-CM | POA: Diagnosis not present

## 2021-12-17 DIAGNOSIS — D125 Benign neoplasm of sigmoid colon: Secondary | ICD-10-CM | POA: Diagnosis not present

## 2021-12-17 DIAGNOSIS — Z08 Encounter for follow-up examination after completed treatment for malignant neoplasm: Secondary | ICD-10-CM | POA: Diagnosis not present

## 2021-12-17 DIAGNOSIS — D124 Benign neoplasm of descending colon: Secondary | ICD-10-CM | POA: Diagnosis not present

## 2021-12-17 DIAGNOSIS — D123 Benign neoplasm of transverse colon: Secondary | ICD-10-CM | POA: Diagnosis not present

## 2021-12-17 DIAGNOSIS — Z86010 Personal history of colonic polyps: Secondary | ICD-10-CM | POA: Diagnosis not present

## 2022-01-31 NOTE — Progress Notes (Signed)
Cardiology Office Note:    Date:  02/01/2022   ID:  [name], DOB July 31, 1945, MRN [id number]  PCP:  Johny Blamer, MD   [facility] Providers Cardiologist:  Christell Constant, MD    Referring MD: Johny Blamer, MD   CC: HTN  History of Present Illness:    [name] is a 77 y.o. male with a hx of HTN, Aortic atherosclerosis Anemia NOS, Colon cancer with no cardiotoxic chemo or radiation who presents for evaluation 07/18/20 for hypoxia on a plan and syncope.   2022: negative stress test and started statin.    Patient notes that he is doing great.   Notes that he is fairly sedentary.  Does chores at home with no symptoms. They had a water leak and had a week digging ditches.  Notes fatigue. There are no interval hospital/ED visit.    No chest pain or pressure .  No SOB/DOE and no PND/Orthopnea.  No weight gain or leg swelling.  No palpitations or syncope .  Ambulatory blood pressure not done.   Past Medical History:  Diagnosis Date   colon ca dx'd 05/2006   surg only   Essential tremor    History of colonic polyps    Hyperlipidemia    Hypertension    Liver disease    Migraine headache    Personal history of other malignant neoplasm of large intestine    Polio    PUD (peptic ulcer disease)    Pure hypercholesterolemia    Rectal bleed    Stage 3a chronic kidney disease (HCC)     Past Surgical History:  Procedure Laterality Date   COLON SURGERY     ESOPHAGOGASTRODUODENOSCOPY  06/02/2011   Procedure: ESOPHAGOGASTRODUODENOSCOPY (EGD);  Surgeon: Barrie Folk, MD;  Location: [facility] ENDOSCOPY;  Service: Endoscopy;  Laterality: N/A;    Current Medications: Current Meds  Medication Sig   acetaminophen (TYLENOL) 500 MG tablet Take 500 mg by mouth every 6 (six) hours as needed.   aspirin-acetaminophen-caffeine (EXCEDRIN MIGRAINE) 250-250-65 MG tablet Take by mouth every 6 (six) hours as needed for headache.   losartan (COZAAR) 100 MG tablet Take 100 mg by  mouth daily.   rosuvastatin (CRESTOR) 20 MG tablet Take 1 tablet (20 mg total) by mouth daily.     Allergies:   Lisinopril and Omeprazole   Social History   Socioeconomic History   Marital status: Married    Spouse name: Not on file   Number of children: Not on file   Years of education: Not on file   Highest education level: Not on file  Occupational History   Not on file  Tobacco Use   Smoking status: Former   Smokeless tobacco: Never   Tobacco comments:    QUIT SMOKING IN 58  Substance and Sexual Activity   Alcohol use: No    Comment: occassional   Drug use: No   Sexual activity: Not Currently  Other Topics Concern   Not on file  Social History Narrative   Not on file   Social Determinants of Health   Financial Resource Strain: Not on file  Food Insecurity: Not on file  Transportation Needs: Not on file  Physical Activity: Not on file  Stress: Not on file  Social Connections: Not on file    Social: Lives with wife and a daughter who is a Engineer, civil (consulting) and son in law who is a Engineer, civil (consulting) Wife has surgery in 2023 and it was a slow recovery; comes  with wife  Family History: The patient's family history includes Heart disease in his father and mother; Lung cancer in his mother; Prostate cancer in his father. Had 9 siblings; some of whom had CAD NOS.  ROS:   Please see the history of present illness.    All other systems reviewed and are negative.  EKGs/Labs/Other Studies Reviewed:    The following studies were reviewed today:  EKG:  EKG is  ordered today.  The ekg ordered today demonstrates  01/31/22: SR SR with LAFB rate 72 PR 196 07/11/20: SR 1st HB and LAFB Borderline inferior infarct pattern 07/18/20:  SR 1st HB   Cardiac Studies & Procedures     STRESS TESTS  MYOCARDIAL PERFUSION IMAGING 08/01/2020  Narrative  The left ventricular ejection fraction is hyperdynamic (>65%).  Nuclear stress EF: 68%.  Blood pressure demonstrated a normal response to  exercise.  There was no ST segment deviation noted during stress.  The study is normal.  This is a low risk study.  Normal stress nuclear study with no ischemia or infarction.  Gated ejection fraction 68% with normal wall motion.               Recent Labs: 03/12/2021: ALT 14  Recent Lipid Panel    Component Value Date/Time   CHOL 137 03/12/2021 0943   TRIG 68 03/12/2021 0943   HDL 57 03/12/2021 0943   CHOLHDL 2.4 03/12/2021 0943   LDLCALC 66 03/12/2021 0943    Physical Exam:    VS:  BP 130/80   Pulse 72   Ht 5\' 6"  (1.676 m)   Wt 209 lb 3.2 oz (94.9 kg)   SpO2 95%   BMI 33.77 kg/m     Wt Readings from Last 3 Encounters:  02/01/22 209 lb 3.2 oz (94.9 kg)  12/08/20 208 lb (94.3 kg)  08/01/20 202 lb (91.6 kg)    Gen: No distress   Neck: No JVD Cardiac: No rubs or gallops, no murmur, normal rhythm, +2 radial pulses Respiratory: Clear to auscultation bilaterally, normal effort, normal  respiratory rate GI: Soft, nontender, non-distended  MS: No  edema;  moves all extremities Integument: Skin feels warm Neuro:  At time of evaluation, alert and oriented to person/place/time/situation  Psych: Normal affect, patient feels well   ASSESSMENT:    1. Essential hypertension   2. Aortic atherosclerosis (HCC)   3. Daytime somnolence   4. Mixed hyperlipidemia   5. Other fatigue     PLAN:    HTN History of colonic malignancy - continue losartan 100 mg - ambulatory BP restart - Home sleep study   Daytime Somnolence STOP-BANG Score for Obstructive Sleep Apnea f  RESULT SUMMARY: 6 points STOP-BANG  High  Risk for moderate to severe OSA   INPUTS: Do you snore loudly? --> 1 = Yes Do you often feel tired, fatigued, or sleepy during the daytime? --> 0 = No Has anyone observed you stop breathing during sleep? --> 1 = Yes Do you have (or are you being treated for) high blood pressure? --> 1 = Yes BMI --> 0 = ?35 kg/m Age --> 1 = >50 years Neck circumference  --> 1 = >40 cm Gender --> 1 = Male   Aortic Atherosclerosis Mixed Hyperlipidemia - continue rosuvastatin 20  One year with Me (offered this vs PRN)    Medication Adjustments/Labs and Tests Ordered: Current medicines are reviewed at length with the patient today.  Concerns regarding medicines are outlined above.  Orders Placed  This Encounter  Procedures   EKG 12-Lead   Itamar Sleep Study    No orders of the defined types were placed in this encounter.    Patient Instructions  Medication Instructions:  Your physician recommends that you continue on your current medications as directed. Please refer to the Current Medication list given to you today.  *If you need a refill on your cardiac medications before your next appointment, please call your pharmacy*   Lab Work: NONE If you have labs (blood work) drawn today and your tests are completely normal, you will receive your results only by: MyChart Message (if you have MyChart) OR A paper copy in the mail If you have any lab test that is abnormal or we need to change your treatment, we will call you to review the results.   Testing/Procedures: Your physician has requested that you have a sleep study.   Follow-Up: At [facility], you and your health needs are our priority.  As part of our continuing mission to provide you with exceptional heart care, we have created designated Provider Care Teams.  These Care Teams include your primary Cardiologist (physician) and Advanced Practice Providers (APPs -  Physician Assistants and Nurse Practitioners) who all work together to provide you with the care you need, when you need it.   Your next appointment:   1 year(s)  Provider:   Christell Constant, MD        Signed, Christell Constant, MD  02/01/2022 10:42 AM    [facility]

## 2022-02-01 ENCOUNTER — Encounter: Payer: Self-pay | Admitting: Internal Medicine

## 2022-02-01 ENCOUNTER — Telehealth: Payer: Self-pay | Admitting: *Deleted

## 2022-02-01 ENCOUNTER — Ambulatory Visit: Payer: PPO | Attending: Internal Medicine | Admitting: Internal Medicine

## 2022-02-01 VITALS — BP 130/80 | HR 72 | Ht 66.0 in | Wt 209.2 lb

## 2022-02-01 DIAGNOSIS — R400 Somnolence: Secondary | ICD-10-CM | POA: Diagnosis not present

## 2022-02-01 DIAGNOSIS — I700 Atherosclerosis of aorta: Secondary | ICD-10-CM

## 2022-02-01 DIAGNOSIS — I10 Essential (primary) hypertension: Secondary | ICD-10-CM

## 2022-02-01 DIAGNOSIS — R5383 Other fatigue: Secondary | ICD-10-CM | POA: Diagnosis not present

## 2022-02-01 DIAGNOSIS — E782 Mixed hyperlipidemia: Secondary | ICD-10-CM

## 2022-02-01 NOTE — Patient Instructions (Signed)
Medication Instructions:  Your physician recommends that you continue on your current medications as directed. Please refer to the Current Medication list given to you today.  *If you need a refill on your cardiac medications before your next appointment, please call your pharmacy*   Lab Work: NONE If you have labs (blood work) drawn today and your tests are completely normal, you will receive your results only by: MyChart Message (if you have MyChart) OR A paper copy in the mail If you have any lab test that is abnormal or we need to change your treatment, we will call you to review the results.   Testing/Procedures: Your physician has requested that you have a sleep study.   Follow-Up: At [facility], you and your health needs are our priority.  As part of our continuing mission to provide you with exceptional heart care, we have created designated Provider Care Teams.  These Care Teams include your primary Cardiologist (physician) and Advanced Practice Providers (APPs -  Physician Assistants and Nurse Practitioners) who all work together to provide you with the care you need, when you need it.   Your next appointment:   1 year(s)  Provider:   Christell Constant, MD

## 2022-02-01 NOTE — Telephone Encounter (Signed)
Dr. Izora Ribas ordered Itamar study for the pt. Pt agreeable to signed waiver and to not to open the box until he has been called with the PIN#.

## 2022-02-08 NOTE — Telephone Encounter (Signed)
Patient's wife is following up to find out in pin# is available.

## 2022-02-11 ENCOUNTER — Other Ambulatory Visit: Payer: Self-pay | Admitting: Internal Medicine

## 2022-02-12 NOTE — Telephone Encounter (Signed)
I have sent a message to the sleep coordinator Coralee North to check on if the pt has been approved for Itamar study. I will let the pt know I have sent a message to sleep coordinator. Once I have if the pt has been approved I will give the pt the PIN#.   I s/w the pt's wife and explained that I sent a message to sleep coordinator to check if pt has been approved. I assured her that I will call once I have the approval and provide the PIN#. Pt's wife thanked me for the call and update.

## 2022-02-19 ENCOUNTER — Encounter (INDEPENDENT_AMBULATORY_CARE_PROVIDER_SITE_OTHER): Payer: PPO | Admitting: Cardiology

## 2022-02-19 DIAGNOSIS — G4733 Obstructive sleep apnea (adult) (pediatric): Secondary | ICD-10-CM

## 2022-02-19 NOTE — Telephone Encounter (Signed)
I s/w the pt's wife and gave her the PIN#. She stated the pt was on the phone right now with the insurance company because he got a letter that the sleep study was approved. I apologized to pt's wife that I myself am in a holding pattern until I hear back from the sleep coordinator. Pt was upset as the delay in our process. .   I gave PIN# [id number]. Pt will do sleep study this week.   Called and made the patient aware that he may proceed with the [facility] Sleep Study. PIN # provided to the patient. Patient made aware that he will be contacted after the test has been read with the results and any recommendations. Patient verbalized understanding and thanked me for the call.

## 2022-02-19 NOTE — Telephone Encounter (Signed)
Prior Authorization for [facility] sent to [facility] via web portal. Tracking Number . READY-[id number] PA REQ-Service Dates:-02/12/2022 - 05/13/2022

## 2022-02-20 ENCOUNTER — Ambulatory Visit: Payer: PPO | Attending: Internal Medicine

## 2022-02-20 DIAGNOSIS — R400 Somnolence: Secondary | ICD-10-CM

## 2022-02-20 NOTE — Procedures (Signed)
SLEEP STUDY REPORT Patient Information Study Date: 02/19/2022 Patient Name: [name] Patient ID: [id number] Birth Date: 1945-11-28 Age: 77 Gender: Male BMI: 33.7 (W=209 lb, H=5' 6'') Stopbang: 6 Referring Physician: Rudean Haskell, MD  TEST DESCRIPTION: Home sleep apnea testing was completed using the WatchPat, a Type 1 device, utilizing peripheral arterial tonometry (PAT), chest movement, actigraphy, pulse oximetry, pulse rate, body position and snore.  AHI was calculated with apnea and hypopnea using valid sleep time as the denominator. RDI includes apneas, hypopneas, and RERAs.  The data acquired and the scoring of sleep and all associated events were performed in accordance with the recommended standards and specifications as outlined in the AASM Manual for the Scoring of Sleep and Associated Events 2.2.0 (2015).  FINDINGS:  1.  Severe Obstructive Sleep Apnea with AHI 45.6/hr.   2.  No significant Central Sleep Apnea with pAHIc7.7 /hr.  3.  Oxygen desaturations as low as 53%.  4.  Severe snoring was present. O2 sats were < 88% for 87.2 min.  5.  Total sleep time was 7 hrs and 33 min.  6.  19.8% of total sleep time was spent in REM sleep.   7.  Shortened sleep onset latency at 5 min.   8.  Prolonged REM sleep onset latency at 103 min.   9.  Total awakenings were 12.  10. Arrhythmia detection:  None.  DIAGNOSIS:   Severe Obstructive Sleep Apnea (G47.33) Nocturnal Hyopxemia  RECOMMENDATIONS:   1.  Clinical correlation of these findings is necessary.  The decision to treat obstructive sleep apnea (OSA) is usually based on the presence of apnea symptoms or the presence of associated medical conditions such as Hypertension, Congestive Heart Failure, Atrial Fibrillation or Obesity.  The most common symptoms of OSA are snoring, gasping for breath while sleeping, daytime sleepiness and fatigue.   2.  Initiating apnea therapy is recommended given the presence of  symptoms and/or associated conditions. Recommend proceeding with one of the following:     a.  Auto-CPAP therapy with a pressure range of 5-20cm H2O.     b.  An oral appliance (OA) that can be obtained from certain dentists with expertise in sleep medicine.  These are primarily of use in non-obese patients with mild and moderate disease.     c.  An ENT consultation which may be useful to look for specific causes of obstruction and possible treatment options.     d.  If patient is intolerant to PAP therapy, consider referral to ENT for evaluation for hypoglossal nerve stimulator.   3.  Close follow-up is necessary to ensure success with CPAP or oral appliance therapy for maximum benefit.  4.  A follow-up oximetry study on CPAP is recommended to assess the adequacy of therapy and determine the need for supplemental oxygen or the potential need for Bi-level therapy.  An arterial blood gas to determine the adequacy of baseline ventilation and oxygenation should also be considered.  5.  Healthy sleep recommendations include:  adequate nightly sleep (normal 7-9 hrs/night), avoidance of caffeine after noon and alcohol near bedtime, and maintaining a sleep environment that is cool, dark and quiet.  6.  Weight loss for overweight patients is recommended.  Even modest amounts of weight loss can significantly improve the severity of sleep apnea.  7.  Snoring recommendations include:  weight loss where appropriate, side sleeping, and avoidance of alcohol before bed.  8.  Operation of motor vehicle should be avoided when  sleepy.  Signature:   Fransico Him, MD; [facility]; [place], [place] Board of Sleep Medicine Electronically Signed: 02/20/2022

## 2022-03-08 ENCOUNTER — Telehealth: Payer: Self-pay | Admitting: *Deleted

## 2022-03-08 ENCOUNTER — Telehealth: Payer: Self-pay | Admitting: Internal Medicine

## 2022-03-08 DIAGNOSIS — R400 Somnolence: Secondary | ICD-10-CM

## 2022-03-08 DIAGNOSIS — G4733 Obstructive sleep apnea (adult) (pediatric): Secondary | ICD-10-CM

## 2022-03-08 DIAGNOSIS — I10 Essential (primary) hypertension: Secondary | ICD-10-CM

## 2022-03-08 NOTE — Telephone Encounter (Signed)
Patient's wife is calling in regards too following up on tests

## 2022-03-08 NOTE — Telephone Encounter (Signed)
Spoke with pt spouse ok per DPR.  Spouse calling to f/u sleep study.  Advised will send message to our sleep coordinator to follow up.

## 2022-03-08 NOTE — Telephone Encounter (Signed)
-----   Message from Lauralee Evener, [place] sent at 02/20/2022  9:12 AM EST -----  ----- Message ----- From: Sueanne Margarita, MD Sent: 02/20/2022   8:59 AM EST To: Cv Div Sleep Studies  Please let patient know that they have sleep apnea.  Recommend therapeutic CPAP titration for treatment of patient's sleep disordered breathing.  If unable to perform an in lab titration then initiate ResMed auto CPAP from 4 to 15cm H2O with heated humidity and mask of choice and overnight pulse ox on CPAP.

## 2022-03-08 NOTE — Telephone Encounter (Signed)
The patient has been notified of the result and verbalized understanding.  All questions (if any) were answered. Marolyn Hammock, CMA 3/78/5885 0:27 PM    Precert titration

## 2022-03-08 NOTE — Telephone Encounter (Signed)
Called patient and gave sleep results.

## 2022-04-10 DIAGNOSIS — I700 Atherosclerosis of aorta: Secondary | ICD-10-CM | POA: Diagnosis not present

## 2022-04-10 DIAGNOSIS — E7800 Pure hypercholesterolemia, unspecified: Secondary | ICD-10-CM | POA: Diagnosis not present

## 2022-04-10 DIAGNOSIS — I10 Essential (primary) hypertension: Secondary | ICD-10-CM | POA: Diagnosis not present

## 2022-04-22 ENCOUNTER — Ambulatory Visit (HOSPITAL_BASED_OUTPATIENT_CLINIC_OR_DEPARTMENT_OTHER): Payer: PPO | Attending: Cardiology | Admitting: Cardiology

## 2022-04-22 VITALS — Ht 67.0 in | Wt 205.0 lb

## 2022-04-22 DIAGNOSIS — G4733 Obstructive sleep apnea (adult) (pediatric): Secondary | ICD-10-CM

## 2022-04-22 DIAGNOSIS — G4736 Sleep related hypoventilation in conditions classified elsewhere: Secondary | ICD-10-CM | POA: Diagnosis not present

## 2022-04-24 NOTE — Procedures (Signed)
   Patient Name: Jeffrey, [name] Date: 04/22/2022 Gender: Male D.O.B: May 25, 1945 Age (years): 77 Referring Provider: Fransico Him MD, ABSM Height (inches): 67 Interpreting Physician: Fransico Him MD, ABSM Weight (lbs): 205 RPSGT: Jorge Ny BMI: 32 MRN: [id number] Neck Size: 17.00  CLINICAL INFORMATION The patient is referred for a BiPAP titration to treat sleep apnea.  SLEEP STUDY TECHNIQUE As per the AASM Manual for the Scoring of Sleep and Associated Events v2.3 (April 2016) with a hypopnea requiring 4% desaturations.  The channels recorded and monitored were frontal, central and occipital EEG, electrooculogram (EOG), submentalis EMG (chin), nasal and oral airflow, thoracic and abdominal wall motion, anterior tibialis EMG, snore microphone, electrocardiogram, and pulse oximetry. Bilevel positive airway pressure (BPAP) was initiated at the beginning of the study and titrated to treat sleep-disordered breathing.  MEDICATIONS Medications self-administered by patient taken the night of the study : N/A  RESPIRATORY PARAMETERS Optimal IPAP Pressure (cm): 22  AHI at Optimal Pressure (/hr) 5.3 Optimal EPAP Pressure (cm):18  Overall Minimal O2 (%):62.0  Minimal O2 at Optimal Pressure (%): 88.0  SLEEP ARCHITECTURE Start Time:10:12:09 PM  Stop Time:4:53:05 AM  Total Time (min):400.9  Total Sleep Time (min):349 Sleep Latency (min):11.3  Sleep Efficiency (%):87.0%  REM Latency (min):78.5  WASO (min): 40.6 Stage N1 (%): 5.7%  Stage N2 (%): 61.2%  Stage N3 (%): 0.0%  Stage R (%):33.1 Supine (%):47.42  Arousal Index (/hr):22.9   CARDIAC DATA The 2 lead EKG demonstrated sinus rhythm. The mean heart rate was 63.0 beats per minute. Other EKG findings include: PVCs.  LEG MOVEMENT DATA The total Periodic Limb Movements of Sleep (PLMS) were 0. The PLMS index was 0.0. A PLMS index of <15 is considered normal in adults.  IMPRESSIONS - An optimal PAP pressure could not be  selected for this patient based on the available study data. - Central sleep apnea was not noted during this titration (CAI = 2.2/h). - Severe oxygen desaturations were observed during this titration (min O2 = 62.0%). - The patient snored with moderate snoring volume. - 2-lead EKG demonstrated: PVCs - Clinically significant periodic limb movements were not noted during this study. Arousals associated with PLMs were rare.  DIAGNOSIS - Obstructive Sleep Apnea (G47.33) - Nocturnal Hypoxemia (G47.36)  RECOMMENDATIONS - Recommend a trial of ResMed Auto BiPAP with IPAP max 22cm H2O, EPAP min 5cm H2O and PS 4cm H2O with heated humidity and mask of choice.  - Avoid alcohol, sedatives and other CNS depressants that may worsen sleep apnea and disrupt normal sleep architecture. - Sleep hygiene should be reviewed to assess factors that may improve sleep quality. - Weight management and regular exercise should be initiated or continued. - Return to Sleep Center for re-evaluation after 4 weeks of therapy  [Electronically signed] 04/24/2022 07:42 AM  Fransico Him MD, ABSM Diplomate, American Board of Sleep Medicine

## 2022-05-01 ENCOUNTER — Telehealth: Payer: Self-pay | Admitting: *Deleted

## 2022-05-01 NOTE — Telephone Encounter (Signed)
-----   Message from Gaynelle Cage, CMA sent at 04/24/2022  8:25 AM EDT -----  ----- Message ----- From: Quintella Reichert, MD Sent: 04/24/2022   7:44 AM EDT To: Cv Div Sleep Studies  Please let patient know that they had a successful PAP titration and let DME know that orders are in EPIC.  Please set up 6 week OV with me.

## 2022-05-01 NOTE — Telephone Encounter (Signed)
The patient has been notified of the result and verbalized understanding.  All questions (if any) were answered. Latrelle Dodrill, CMA 05/01/2022 3:39 PM    Upon patient request DME selection is [facility] Patient understands he will be contacted by [facility] to set up his cpap. Patient understands to call if [facility] does not contact him with new setup in a timely manner. Patient understands they will be called once confirmation has been received from [facility] that they have received their new machine to schedule 10 week follow up appointment.   [facility] notified of new cpap order  Please add to airview Patient was grateful for the call and thanked me.

## 2022-05-21 DIAGNOSIS — G4733 Obstructive sleep apnea (adult) (pediatric): Secondary | ICD-10-CM | POA: Diagnosis not present

## 2022-06-20 DIAGNOSIS — G4733 Obstructive sleep apnea (adult) (pediatric): Secondary | ICD-10-CM | POA: Diagnosis not present

## 2022-07-17 ENCOUNTER — Encounter: Payer: Self-pay | Admitting: Cardiology

## 2022-07-17 ENCOUNTER — Ambulatory Visit: Payer: PPO | Attending: Cardiology | Admitting: Cardiology

## 2022-07-17 VITALS — BP 144/86 | HR 87 | Ht 67.0 in | Wt 210.4 lb

## 2022-07-17 DIAGNOSIS — I10 Essential (primary) hypertension: Secondary | ICD-10-CM

## 2022-07-17 DIAGNOSIS — G4733 Obstructive sleep apnea (adult) (pediatric): Secondary | ICD-10-CM | POA: Diagnosis not present

## 2022-07-17 NOTE — Patient Instructions (Signed)
Medication Instructions:  Your physician recommends that you continue on your current medications as directed. Please refer to the Current Medication list given to you today.  *If you need a refill on your cardiac medications before your next appointment, please call your pharmacy*   Lab Work: None.  If you have labs (blood work) drawn today and your tests are completely normal, you will receive your results only by: MyChart Message (if you have MyChart) OR A paper copy in the mail If you have any lab test that is abnormal or we need to change your treatment, we will call you to review the results.   Testing/Procedures: Your physician has recommended that you have a sleep study. Specifically, this is a bipap titration. This test records several body functions during sleep, including: brain activity, eye movement, oxygen and carbon dioxide blood levels, heart rate and rhythm, breathing rate and rhythm, the flow of air through your mouth and nose, snoring, body muscle movements, and chest and belly movement.    Follow-Up: At [facility], you and your health needs are our priority.  As part of our continuing mission to provide you with exceptional heart care, we have created designated Provider Care Teams.  These Care Teams include your primary Cardiologist (physician) and Advanced Practice Providers (APPs -  Physician Assistants and Nurse Practitioners) who all work together to provide you with the care you need, when you need it.  We recommend signing up for the patient portal called "MyChart".  Sign up information is provided on this After Visit Summary.  MyChart is used to connect with patients for Virtual Visits (Telemedicine).  Patients are able to view lab/test results, encounter notes, upcoming appointments, etc.  Non-urgent messages can be sent to your provider as well.   To learn more about what you can do with MyChart, go to [email or web address].    Your next  appointment will be dependent on the results of your sleep study/bipap titration and it will be with:    Provider:   Dr. Armanda Magic, MD

## 2022-07-17 NOTE — Addendum Note (Signed)
Addended by: Luellen Pucker on: 07/17/2022 02:03 PM   Modules accepted: Orders

## 2022-07-17 NOTE — Progress Notes (Signed)
Sleep Medicine CONSULT Note    Date:  07/17/2022   ID:  [name], DOB 1945/09/23, MRN [id number]  PCP:  Noberto Retort, MD  Cardiologist: Christell Constant, MD   Chief Complaint  Patient presents with   New Patient (Initial Visit)    Obstructive sleep apnea    History of Present Illness:  [name] is a 77 y.o. male who is being seen today for the evaluation of obstructive sleep apnea at the request of [name], MD.   This is a 77 year old male with a history of hyperlipidemia, hypertension, liver disease, peptic ulcer disease and CKD stage IIIa.  He was seen by cardiology in January at which time he complained of feeling fatigue during the day and sleeping poorly and his wife would say that he snored terrible.  He also got up to use the bathroom multiple times a night.Marland Kitchen  STOP-BANG score was 6 with excessive daytime sleepiness as well as snoring.  A home sleep study was ordered which showed severe obstructive sleep apnea with an AHI of 45.6/h and a central AHI of 7.7/h.  Nocturnal hypoxemia was present with lowest O2 saturation 53%.  He underwent CPAP titration but due to ongoing respiratory events was started on auto BiPAP with an IPAP max of 22 cm H2O, EPAP min 5 cm H2O and pressure support 4 cm H2O.  He is now referred for sleep medicine consultation for treatment of his obstructive sleep apnea.  He is doing well with his PAP device and thinks that he has gotten used to it.  He tolerates the full face mask and feels the pressure is adequate.  Since going on PAP he feels rested in the am and has no significant daytime sleepiness.  He denies any significant mouth or nasal dryness or nasal congestion.  He does not think that he snores.     Past Medical History:  Diagnosis Date   colon ca dx'd 05/2006   surg only   Essential tremor    History of colonic polyps    Hyperlipidemia    Hypertension    Liver disease    Migraine headache    Personal history  of other malignant neoplasm of large intestine    Polio    PUD (peptic ulcer disease)    Pure hypercholesterolemia    Rectal bleed    Stage 3a chronic kidney disease (HCC)     Past Surgical History:  Procedure Laterality Date   COLON SURGERY     ESOPHAGOGASTRODUODENOSCOPY  06/02/2011   Procedure: ESOPHAGOGASTRODUODENOSCOPY (EGD);  Surgeon: Barrie Folk, MD;  Location: [facility]COPY;  Service: Endoscopy;  Laterality: N/A;    Current Medications: Current Meds  Medication Sig   acetaminophen (TYLENOL) 500 MG tablet Take 500 mg by mouth every 6 (six) hours as needed.   losartan (COZAAR) 100 MG tablet Take 100 mg by mouth daily.   rosuvastatin (CRESTOR) 20 MG tablet Take 1 tablet (20 mg total) by mouth daily.   sildenafil (VIAGRA) 100 MG tablet 1 tablet as needed Orally Once a day for 30 days    Allergies:   Lisinopril and Omeprazole   Social History   Socioeconomic History   Marital status: Married    Spouse name: Not on file   Number of children: Not on file   Years of education: Not on file   Highest education level: Not on file  Occupational History   Not on file  Tobacco Use  Smoking status: Former   Smokeless tobacco: Never   Tobacco comments:    QUIT SMOKING IN 1988  Substance and Sexual Activity   Alcohol use: No    Comment: occassional   Drug use: No   Sexual activity: Not Currently  Other Topics Concern   Not on file  Social History Narrative   Not on file   Social Determinants of Health   Financial Resource Strain: Not on file  Food Insecurity: Not on file  Transportation Needs: Not on file  Physical Activity: Not on file  Stress: Not on file  Social Connections: Not on file     Family History:  The patient's family history includes Heart disease in his father and mother; Lung cancer in his mother; Prostate cancer in his father.   ROS:   Please see the history of present illness.    ROS All other systems reviewed and are negative.      No data  to display             PHYSICAL EXAM:   VS:  BP (!) 144/86   Pulse 87   Ht 5\' 7"  (1.702 m)   Wt 210 lb 6.4 oz (95.4 kg)   SpO2 95%   BMI 32.95 kg/m    GEN: Well nourished, well developed, in no acute distress  HEENT: normal  Neck: no JVD, carotid bruits, or masses Cardiac: RRR; no murmurs, rubs, or gallops,no edema.  Intact distal pulses bilaterally.  Respiratory:  clear to auscultation bilaterally, normal work of breathing GI: soft, nontender, nondistended, + BS MS: no deformity or atrophy  Skin: warm and dry, no rash Neuro:  Alert and Oriented x 3, Strength and sensation are intact Psych: euthymic mood, full affect  Wt Readings from Last 3 Encounters:  07/17/22 210 lb 6.4 oz (95.4 kg)  04/22/22 205 lb (93 kg)  02/01/22 209 lb 3.2 oz (94.9 kg)      Studies/Labs Reviewed:   HST, PAP titration and PAP compliance download  Recent Labs: No results found for requested labs within last 365 days.    Additional studies/ records that were reviewed today include:  none    ASSESSMENT:    1. OSA (obstructive sleep apnea)   2. Essential hypertension      PLAN:  In order of problems listed above:  OSA - The patient is tolerating PAP therapy well without any problems. The PAP download performed by his DME was personally reviewed and interpreted by me today and showed an AHI of 61/hr on auto BIPAP  with 90% compliance in using more than 4 hours nightly.  The patient has been using and benefiting from PAP use and will continue to benefit from therapy.  -his AHI is markedly elevated and actually worse than his baseline home sleep study>>?whether he is have central events -I stressed the importance of avoiding sleeping on his back -Recommended that he change out his cushion every 4 weeks to avoid leakage -will send back to sleep lab for an auto BIPAP ST titration  2.  HTN -BP is borderline controlled on exam today -Continue prescription drug management with losartan  100 mg daily with as needed refills  Time Spent: 20 minutes total time of encounter, including 15 minutes spent in face-to-face patient care on the date of this encounter. This time includes coordination of care and counseling regarding above mentioned problem list. Remainder of non-face-to-face time involved reviewing chart documents/testing relevant to the patient encounter and documentation in the  medical record. I have independently reviewed documentation from referring provider  Medication Adjustments/Labs and Tests Ordered: Current medicines are reviewed at length with the patient today.  Concerns regarding medicines are outlined above.  Medication changes, Labs and Tests ordered today are listed in the Patient Instructions below.  There are no Patient Instructions on file for this visit.   Signed, Armanda Magic, MD  07/17/2022 1:47 PM    [facility] [address] [place], [place], [place]  [address] Phone: [phone]; Fax: [phone]

## 2022-07-18 NOTE — Addendum Note (Signed)
Addended by: Brunetta Genera on: 07/18/2022 02:33 PM   Modules accepted: Orders

## 2022-07-21 DIAGNOSIS — G4733 Obstructive sleep apnea (adult) (pediatric): Secondary | ICD-10-CM | POA: Diagnosis not present

## 2022-07-26 DIAGNOSIS — G4733 Obstructive sleep apnea (adult) (pediatric): Secondary | ICD-10-CM | POA: Diagnosis not present

## 2022-08-05 DIAGNOSIS — H353131 Nonexudative age-related macular degeneration, bilateral, early dry stage: Secondary | ICD-10-CM | POA: Diagnosis not present

## 2022-08-05 DIAGNOSIS — H52223 Regular astigmatism, bilateral: Secondary | ICD-10-CM | POA: Diagnosis not present

## 2022-08-05 DIAGNOSIS — H524 Presbyopia: Secondary | ICD-10-CM | POA: Diagnosis not present

## 2022-08-05 DIAGNOSIS — H5211 Myopia, right eye: Secondary | ICD-10-CM | POA: Diagnosis not present

## 2022-08-05 DIAGNOSIS — H2513 Age-related nuclear cataract, bilateral: Secondary | ICD-10-CM | POA: Diagnosis not present

## 2022-08-20 DIAGNOSIS — G4733 Obstructive sleep apnea (adult) (pediatric): Secondary | ICD-10-CM | POA: Diagnosis not present

## 2022-08-29 DIAGNOSIS — H259 Unspecified age-related cataract: Secondary | ICD-10-CM | POA: Diagnosis not present

## 2022-08-29 DIAGNOSIS — Z87891 Personal history of nicotine dependence: Secondary | ICD-10-CM | POA: Diagnosis not present

## 2022-08-29 DIAGNOSIS — E669 Obesity, unspecified: Secondary | ICD-10-CM | POA: Diagnosis not present

## 2022-08-29 DIAGNOSIS — E785 Hyperlipidemia, unspecified: Secondary | ICD-10-CM | POA: Diagnosis not present

## 2022-08-29 DIAGNOSIS — I10 Essential (primary) hypertension: Secondary | ICD-10-CM | POA: Diagnosis not present

## 2022-08-29 DIAGNOSIS — G4733 Obstructive sleep apnea (adult) (pediatric): Secondary | ICD-10-CM | POA: Diagnosis not present

## 2022-08-29 DIAGNOSIS — H3530 Unspecified macular degeneration: Secondary | ICD-10-CM | POA: Diagnosis not present

## 2022-09-20 DIAGNOSIS — G4733 Obstructive sleep apnea (adult) (pediatric): Secondary | ICD-10-CM | POA: Diagnosis not present

## 2022-10-01 ENCOUNTER — Telehealth: Payer: Self-pay | Admitting: Internal Medicine

## 2022-10-01 NOTE — Telephone Encounter (Signed)
I left a message first apologizing that I was not informed that he needed an Itamar study. I left message that I will call him back tomorrow to arrange a set up. I am not sure who is the ordering the sleep study.

## 2022-10-01 NOTE — Telephone Encounter (Signed)
Pt's spouse is requesting a callback regarding them being told at last office visit another Sleep test would be done and Okey Regal would call them but they hadn't heard anything. Please advise

## 2022-10-02 ENCOUNTER — Telehealth: Payer: Self-pay | Admitting: *Deleted

## 2022-10-02 DIAGNOSIS — R400 Somnolence: Secondary | ICD-10-CM

## 2022-10-02 DIAGNOSIS — I10 Essential (primary) hypertension: Secondary | ICD-10-CM

## 2022-10-02 DIAGNOSIS — G4733 Obstructive sleep apnea (adult) (pediatric): Secondary | ICD-10-CM

## 2022-10-02 NOTE — Telephone Encounter (Signed)
Per Dr Mayford Knife, will send back to sleep lab for an auto BIPAP ST titration   Wife called today to get patient scheduled for the Bipap titration.  Prior Authorization for BIPAP TITRATION  sent to [facility] via web portal. Tracking Number [id number] Service Dates:AUTHORIZED 10/02/2022 - 12/31/2022

## 2022-10-02 NOTE — Telephone Encounter (Signed)
Thank you Coralee North for the update that it is for BIPAP and not needing an Special educational needs teacher.

## 2022-10-03 NOTE — Telephone Encounter (Signed)
Prior Authorization for BIPAP TITRATION sent to [facility] via web portal. Tracking Number . Approved-Service Dates: 10/02/2022 - 12/31/2022

## 2022-10-10 DIAGNOSIS — Z125 Encounter for screening for malignant neoplasm of prostate: Secondary | ICD-10-CM | POA: Diagnosis not present

## 2022-10-10 DIAGNOSIS — G250 Essential tremor: Secondary | ICD-10-CM | POA: Diagnosis not present

## 2022-10-10 DIAGNOSIS — Z23 Encounter for immunization: Secondary | ICD-10-CM | POA: Diagnosis not present

## 2022-10-10 DIAGNOSIS — L309 Dermatitis, unspecified: Secondary | ICD-10-CM | POA: Diagnosis not present

## 2022-10-10 DIAGNOSIS — E7800 Pure hypercholesterolemia, unspecified: Secondary | ICD-10-CM | POA: Diagnosis not present

## 2022-10-10 DIAGNOSIS — Z85038 Personal history of other malignant neoplasm of large intestine: Secondary | ICD-10-CM | POA: Diagnosis not present

## 2022-10-10 DIAGNOSIS — Z0000 Encounter for general adult medical examination without abnormal findings: Secondary | ICD-10-CM | POA: Diagnosis not present

## 2022-10-10 DIAGNOSIS — I10 Essential (primary) hypertension: Secondary | ICD-10-CM | POA: Diagnosis not present

## 2022-10-10 DIAGNOSIS — Z9989 Dependence on other enabling machines and devices: Secondary | ICD-10-CM | POA: Diagnosis not present

## 2022-10-21 DIAGNOSIS — G4733 Obstructive sleep apnea (adult) (pediatric): Secondary | ICD-10-CM | POA: Diagnosis not present

## 2022-11-03 ENCOUNTER — Ambulatory Visit (HOSPITAL_BASED_OUTPATIENT_CLINIC_OR_DEPARTMENT_OTHER): Payer: PPO | Attending: Cardiology | Admitting: Cardiology

## 2022-11-03 VITALS — Ht 67.0 in | Wt 205.0 lb

## 2022-11-03 DIAGNOSIS — G4733 Obstructive sleep apnea (adult) (pediatric): Secondary | ICD-10-CM | POA: Diagnosis not present

## 2022-11-03 DIAGNOSIS — I10 Essential (primary) hypertension: Secondary | ICD-10-CM | POA: Insufficient documentation

## 2022-11-03 DIAGNOSIS — R400 Somnolence: Secondary | ICD-10-CM | POA: Insufficient documentation

## 2022-11-11 NOTE — Procedures (Signed)
    Patient Name: [name], [name] Date: 11/03/2022 Gender: Male D.O.B: 1945/03/19 Age (years): 45 Referring Provider: Armanda Magic MD, ABSM Height (inches): 67 Interpreting Physician: Armanda Magic MD, ABSM Weight (lbs): 205 RPSGT: Cherylann Parr BMI: 32 MRN: [id number] Neck Size: 17.00  CLINICAL INFORMATION The patient is referred for a BiPAP titration to treat sleep apnea.  SLEEP STUDY TECHNIQUE As per the AASM Manual for the Scoring of Sleep and Associated Events v2.3 (April 2016) with a hypopnea requiring 4% desaturations.  The channels recorded and monitored were frontal, central and occipital EEG, electrooculogram (EOG), submentalis EMG (chin), nasal and oral airflow, thoracic and abdominal wall motion, anterior tibialis EMG, snore microphone, electrocardiogram, and pulse oximetry. Bilevel positive airway pressure (BPAP) was initiated at the beginning of the study and titrated to treat sleep-disordered breathing.  MEDICATIONS Medications self-administered by patient taken the night of the study : N/A  RESPIRATORY PARAMETERS Optimal IPAP Pressure (cm): 20  AHI at Optimal Pressure (/hr) 12.5 Optimal EPAP Pressure (cm):16  Overall Minimal O2 (%):71.0  Minimal O2 at Optimal Pressure (%): 91.0  SLEEP ARCHITECTURE Start Time:10:57:56 PM  Stop Time:4:53:57 AM  Total Time (min):356  Total Sleep Time (min):306 Sleep Latency (min):7.8  Sleep Efficiency (%):85.9%  REM Latency (min):138.5  WASO (min):42.2 Stage N1 (%):3.6%  Stage N2 (%):69.8%  Stage N3 (%):0.0%  Stage R (%):26.6 Supine (%):100.00  Arousal Index (/hr):5.1   CARDIAC DATA The 2 lead EKG demonstrated sinus rhythm. The mean heart rate was 55.9 beats per minute. Other EKG findings include: None.  LEG MOVEMENT DATA The total Periodic Limb Movements of Sleep (PLMS) were 0. The PLMS index was 0.0. A PLMS index of <15 is considered normal in adults.  IMPRESSIONS - An optimal PAP pressure was selected for this  patient ( 20 / cm of water) - Central sleep apnea was not noted during this titration (CAI = 1.4/h). - Severe oxygen desaturations were observed during this titration (min O2 = 71.0%). - No snoring was audible during this study. - No cardiac abnormalities were observed during this study. - Clinically significant periodic limb movements were not noted during this study. Arousals associated with PLMs were rare.  DIAGNOSIS - Obstructive Sleep Apnea (G47.33)  RECOMMENDATIONS - Trial of ResMed BiPAP therapy on 20/16 cm H2O with a Large size Resmed Full Face AirFit F10 mask and heated humidification. - Avoid alcohol, sedatives and other CNS depressants that may worsen sleep apnea and disrupt normal sleep architecture. - Sleep hygiene should be reviewed to assess factors that may improve sleep quality. - Weight management and regular exercise should be initiated or continued. - Return to Sleep Center for re-evaluation after 4 weeks of therapy  [Electronically signed] 11/11/2022 08:17 PM  Armanda Magic MD, ABSM Diplomate, American Board of Sleep Medicine

## 2022-11-12 ENCOUNTER — Telehealth: Payer: Self-pay

## 2022-11-12 NOTE — Telephone Encounter (Signed)
-----   Message from Armanda Magic sent at 11/11/2022  8:19 PM EDT ----- Please let patient know that they had a successful PAP titration and let DME know that orders are in EPIC.  Please set up 6 week OV with me.

## 2022-11-12 NOTE — Telephone Encounter (Signed)
Notified patient of BiPAP Titration results. All questions were answered and patient verbalized understanding. Order sent to [facility] today 11/12/22

## 2022-11-18 DIAGNOSIS — G4733 Obstructive sleep apnea (adult) (pediatric): Secondary | ICD-10-CM | POA: Diagnosis not present

## 2022-11-20 DIAGNOSIS — G4733 Obstructive sleep apnea (adult) (pediatric): Secondary | ICD-10-CM | POA: Diagnosis not present

## 2022-12-21 DIAGNOSIS — G4733 Obstructive sleep apnea (adult) (pediatric): Secondary | ICD-10-CM | POA: Diagnosis not present

## 2023-01-20 DIAGNOSIS — G4733 Obstructive sleep apnea (adult) (pediatric): Secondary | ICD-10-CM | POA: Diagnosis not present

## 2023-02-05 ENCOUNTER — Ambulatory Visit: Payer: PPO | Attending: Internal Medicine | Admitting: Internal Medicine

## 2023-02-05 NOTE — Progress Notes (Deleted)
Cardiology Office Note:    Date:  02/05/2023   ID:  [name], DOB October 28, 1945, MRN [id number]  PCP:  Noberto Retort, MD   [facility] Providers Cardiologist:  Christell Constant, MD    Referring MD: Noberto Retort, MD   CC: HTN  History of Present Illness:    [name] is a 78 y.o. male with a hx of HTN, Aortic atherosclerosis Anemia NOS, Colon cancer with no cardiotoxic chemo or radiation who presents for evaluation 07/18/20 for hypoxia on a plan and syncope.   2022: negative stress test and started statin.    Patient notes that he is doing great.   Notes that he is fairly sedentary.  Does chores at home with no symptoms. They had a water leak and had a week digging ditches.  Notes fatigue. There are no interval hospital/ED visit.    No chest pain or pressure .  No SOB/DOE and no PND/Orthopnea.  No weight gain or leg swelling.  No palpitations or syncope .  Ambulatory blood pressure not done.   Past Medical History:  Diagnosis Date   colon ca dx'd 05/2006   surg only   Essential tremor    History of colonic polyps    Hyperlipidemia    Hypertension    Liver disease    Migraine headache    Personal history of other malignant neoplasm of large intestine    Polio    PUD (peptic ulcer disease)    Pure hypercholesterolemia    Rectal bleed    Stage 3a chronic kidney disease (HCC)     Past Surgical History:  Procedure Laterality Date   COLON SURGERY     ESOPHAGOGASTRODUODENOSCOPY  06/02/2011   Procedure: ESOPHAGOGASTRODUODENOSCOPY (EGD);  Surgeon: Barrie Folk, MD;  Location[facility] ENDOSCOPY;  Service: Endoscopy;  Laterality: N/A;    Current Medications: No outpatient medications have been marked as taking for the 02/05/23 encounter (Appointment) with Christell Constant, MD.     Allergies:   Lisinopril and Omeprazole   Social History   Socioeconomic History   Marital status: Married    Spouse name: Not on file   Number of children: Not on  file   Years of education: Not on file   Highest education level: Not on file  Occupational History   Not on file  Tobacco Use   Smoking status: Former   Smokeless tobacco: Never   Tobacco comments:    QUIT SMOKING IN 41  Substance and Sexual Activity   Alcohol use: No    Comment: occassional   Drug use: No   Sexual activity: Not Currently  Other Topics Concern   Not on file  Social History Narrative   Not on file   Social Drivers of Health   Financial Resource Strain: Not on file  Food Insecurity: Not on file  Transportation Needs: Not on file  Physical Activity: Not on file  Stress: Not on file  Social Connections: Not on file    Social: Lives with wife and a daughter who is a Engineer, civil (consulting) and son in law who is a Engineer, civil (consulting) Wife has surgery in 2023 and it was a slow recovery; comes with wife  Family History: The patient's family history includes Heart disease in his father and mother; Lung cancer in his mother; Prostate cancer in his father. Had 9 siblings; some of whom had CAD NOS.  ROS:   Please see the history of present illness.    All  other systems reviewed and are negative.  EKGs/Labs/Other Studies Reviewed:    The following studies were reviewed today:  EKG:  EKG is  ordered today.  The ekg ordered today demonstrates  01/31/22: SR SR with LAFB rate 72 PR 196 07/11/20: SR 1st HB and LAFB Borderline inferior infarct pattern 07/18/20:  SR 1st HB   Cardiac Studies & Procedures     STRESS TESTS  MYOCARDIAL PERFUSION IMAGING 08/01/2020  Narrative  The left ventricular ejection fraction is hyperdynamic (>65%).  Nuclear stress EF: 68%.  Blood pressure demonstrated a normal response to exercise.  There was no ST segment deviation noted during stress.  The study is normal.  This is a low risk study.  Normal stress nuclear study with no ischemia or infarction.  Gated ejection fraction 68% with normal wall motion.               Recent Labs: No results found  for requested labs within last 365 days.  Recent Lipid Panel    Component Value Date/Time   CHOL 137 03/12/2021 0943   TRIG 68 03/12/2021 0943   HDL 57 03/12/2021 0943   CHOLHDL 2.4 03/12/2021 0943   LDLCALC 66 03/12/2021 0943    Physical Exam:    VS:  There were no vitals taken for this visit.    Wt Readings from Last 3 Encounters:  11/03/22 205 lb (93 kg)  07/17/22 210 lb 6.4 oz (95.4 kg)  04/22/22 205 lb (93 kg)    Gen: No distress   Neck: No JVD Cardiac: No rubs or gallops, no murmur, normal rhythm, +2 radial pulses Respiratory: Clear to auscultation bilaterally, normal effort, normal  respiratory rate GI: Soft, nontender, non-distended  MS: No  edema;  moves all extremities Integument: Skin feels warm Neuro:  At time of evaluation, alert and oriented to person/place/time/situation  Psych: Normal affect, patient feels well   ASSESSMENT:    No diagnosis found.   PLAN:    HTN History of colonic malignancy - continue losartan 100 mg - ambulatory BP restart - Home sleep study    BIFB  Aorthic atherosclerosis HLD  One year with Me (offered this vs PRN)    Medication Adjustments/Labs and Tests Ordered: Current medicines are reviewed at length with the patient today.  Concerns regarding medicines are outlined above.  No orders of the defined types were placed in this encounter.   No orders of the defined types were placed in this encounter.    There are no Patient Instructions on file for this visit.   Signed, Christell Constant, MD  02/05/2023 10:15 AM    [facility]

## 2023-02-06 ENCOUNTER — Encounter: Payer: Self-pay | Admitting: Internal Medicine

## 2023-03-27 ENCOUNTER — Ambulatory Visit: Payer: PPO | Attending: Nurse Practitioner | Admitting: Nurse Practitioner

## 2023-03-27 ENCOUNTER — Encounter: Payer: Self-pay | Admitting: Nurse Practitioner

## 2023-03-27 VITALS — BP 114/82 | HR 77 | Ht 67.0 in | Wt 217.0 lb

## 2023-03-27 DIAGNOSIS — R0602 Shortness of breath: Secondary | ICD-10-CM

## 2023-03-27 DIAGNOSIS — R600 Localized edema: Secondary | ICD-10-CM

## 2023-03-27 DIAGNOSIS — I700 Atherosclerosis of aorta: Secondary | ICD-10-CM | POA: Diagnosis not present

## 2023-03-27 DIAGNOSIS — I10 Essential (primary) hypertension: Secondary | ICD-10-CM

## 2023-03-27 DIAGNOSIS — R6889 Other general symptoms and signs: Secondary | ICD-10-CM

## 2023-03-27 DIAGNOSIS — I440 Atrioventricular block, first degree: Secondary | ICD-10-CM | POA: Diagnosis not present

## 2023-03-27 NOTE — Progress Notes (Signed)
 Cardiology Office Note:  .   Date:  03/27/2023  ID:  [name], DOB Apr 30, 1945, MRN [id number] PCP: Noberto Retort, MD  [facility] Providers Cardiologist:  Christell Constant, MD    Patient Profile: .      PMH Hypertension Aortic atherosclerosis Syncope Former tobacco abuse Quit smoking 1990 Colon cancer Treated surgically, no chemo or radiation OSA on CPAP  Initially followed by Dr.Varanasi for family history of heart disease and fatigue.  His father had a pacer in his 44s and his mother had an MI.  No early CAD that he is aware of.  Cancer is also prevalent in his family.  He underwent exercise stress test which was low risk.  He returned 07/18/2020 and was seen by Dr. Raynelle Jan for evaluation of syncope.  He and his wife had gone on a 7-day cruise to [place] and while flying, he felt hot and diaphoretic got nauseated and passed out.  His oxygen was checked and he was found to be hypoxic.  BP 141/87.  He was not responsive and diaphoretic but improved with oxygen.  He had not eaten that morning but had had coffee.  After being given some food he felt better and was seen by EMS afterward.  No further interventions.  Prior syncopal workup in 2016 was largely benign.  This feels similar.  EKG revealed sinus rhythm with first-degree HB.  He was advised to start rosuvastatin 5 mg daily.  He underwent exercise myocardial perfusion which revealed normal LVEF, normal response to exercise, no ischemia or infarction, low risk study.  At follow-up visit 02/01/2022 with Dr. Raynelle Jan he was feeling well with no concerning cardiac symptoms.  He admitted he was fairly sedentary but did stick a ditch recently due to a water leak with no problems.  There was concern for OSA with a STOP-BANG score of 6.    Home sleep study revealed severe sleep apnea with an AHI of 45.6 per H and a central AHI of 7.7 per H with lowest O2 saturation 53%.  He was seen by Dr. Mayford Knife for sleep  management on 07/17/2022.       History of Present Illness: .   [name] is a very pleasant 78 y.o. male who is here today with his wife for follow-up of hypertension and OSA. He is having trouble sleeping with his CPAP. He reports tossing and turning throughout the night and feels that the mask is leaking. Initially, he was sleeping well despite high event numbers. However, after the pressure on the machine was increased to lower the event numbers, he reports waking up every hour or two to reset his mask due to air leakage. He typically uses the machine for four hours before removing it, though occasionally he manages six hours of use. He has sought assistance from the provider of the machine, who suggested the mask may be too tight. Despite adjustments, he continues to struggle. We discussed his current activity level. He reports feeling tired easily, particularly when attempting to do work around the house. He attributes this primarily to recent weight gain. He denies chest pain, shortness of breath, palpitations, orthopnea or PND. No discomfort in his upper body suggestive of angina. He has a history of smoking, but quit approximately 30 years ago. He also reports occasional swelling in his feet and ankles, which improves with elevation.  Discussed the use of AI scribe software for clinical note transcription with the patient, who gave verbal consent to  proceed.   ROS: See HPI       Studies Reviewed: Marland Kitchen   EKG Interpretation Date/Time:  Thursday March 27 2023 10:56:09 EST Ventricular Rate:  77 PR Interval:  238 QRS Duration:  98 QT Interval:  370 QTC Calculation: 418 R Axis:   -48  Text Interpretation: Sinus rhythm with 1st degree A-V block Left axis deviation PR has increased since last tracing Confirmed by Eligha Bridegroom [phone]) on 03/27/2023 11:05:34 AM     No results found for: "LIPOA"   Risk Assessment/Calculations:             Physical Exam:   VS:  BP 114/82   Pulse 77    Ht 5\' 7"  (1.702 m)   Wt 217 lb (98.4 kg)   SpO2 99%   BMI 33.99 kg/m    Wt Readings from Last 3 Encounters:  03/27/23 217 lb (98.4 kg)  11/03/22 205 lb (93 kg)  07/17/22 210 lb 6.4 oz (95.4 kg)    GEN: Well nourished, well developed in no acute distress NECK: No JVD; No carotid bruits CARDIAC: RRR, no murmurs, rubs, gallops RESPIRATORY:  Clear to auscultation without rales, wheezing or rhonchi  ABDOMEN: Soft, non-tender, non-distended EXTREMITIES:  Non-pitting bilateral LE edema; No deformity     ASSESSMENT AND PLAN: .    Bilateral leg edema: Recent increase in bilateral LE edema.  He admits to sitting often and not elevating his legs.  He denies shortness of breath although he does admit to activity intolerance.  He admits to dietary indiscretion and weight gain recently as well.  No orthopnea, PND, or chest pain.  Leg elevation encouraged.  We will get an echocardiogram for evaluation of heart and valve function.  Activity intolerance: Lengthy discussion about symptoms associated with activity intolerance.  He admits to a sedentary lifestyle and says he avoids strenuous activities is much as possible.  Was recently very fatigued after moving stuff out of the kitchen for remodel.  He reports symptoms improved after 10 to 15 minutes of rest.  Consideration discussed that this could be anginal equivalent.  He denies chest pain, dyspnea, or other associated symptoms with the fatigue.  We will get echocardiogram for evaluation of heart and valve function as noted above.  I have asked him to notify us if he develops any concerning symptoms.  First degree AV block: EKG reveals SR with 1st degree AV block at 77 bpm, PR interval 238. He denies lightheadedness, presyncope, or syncope. Advised him to notify us if symptoms develop.  He is not on AV nodal blocking agent.  Hypertension: BP is well controlled. Management per PCP.   Hyperlipidemia LDL goal < 70: Lipid panel completed 10/10/2022 with  total cholesterol 122, HDL 51, LDL 57, and triglycerides 67. Encouraged heart healthy mostly plant based diet avoiding saturated fat, processed foods, simple carbohydrates, and sugar along with aiming for at least 150 minutes of moderate intensity exercise each week. Continue rosuvastatin.  Aortic atherosclerosis: As noted above, lengthy discussion about consideration of activity intolerance as symptom of angina in the setting of known atherosclerosis. EKG today with no ST abnormality. Lipids are well-controlled.  I have asked him to notify us if symptoms worsen.  OSA on BiPap: CPAP intolerance due to increased pressure settings causing mask blowout and frequent awakenings. He has been encouraged to avoid supine position, thinks he mostly sleeps on his side. Advised I will contact Dr. Mayford Knife regarding CPAP difficulties.       Disposition:1 year with  Dr. Izora Ribas  Signed, Eligha Bridegroom, NP-C

## 2023-03-27 NOTE — Patient Instructions (Signed)
 Medication Instructions:   Your physician recommends that you continue on your current medications as directed. Please refer to the Current Medication list given to you today.   *If you need a refill on your cardiac medications before your next appointment, please call your pharmacy*   Lab Work:  None ordered.  If you have labs (blood work) drawn today and your tests are completely normal, you will receive your results only by: MyChart Message (if you have MyChart) OR A paper copy in the mail If you have any lab test that is abnormal or we need to change your treatment, we will call you to review the results.   Testing/Procedures:  Your physician has requested that you have an echocardiogram. Echocardiography is a painless test that uses sound waves to create images of your heart. It provides your doctor with information about the size and shape of your heart and how well your heart's chambers and valves are working. This procedure takes approximately one hour. There are no restrictions for this procedure. Please do NOT wear cologne, aftershave, or lotions (deodorant is allowed). Please arrive 15 minutes prior to your appointment time.  Please note: We ask at that you not bring children with you during ultrasound (echo/ vascular) testing. Due to room size and safety concerns, children are not allowed in the ultrasound rooms during exams. Our front office staff cannot provide observation of children in our lobby area while testing is being conducted. An adult accompanying a patient to their appointment will only be allowed in the ultrasound room at the discretion of the ultrasound technician under special circumstances. We apologize for any inconvenience.    Follow-Up: At [facility], [place], you and your health needs are our priority.  As part of our continuing mission to provide you with exceptional heart care, we have created designated Provider Care Teams.  These Care Teams include  your primary Cardiologist (physician) and Advanced Practice Providers (APPs -  Physician Assistants and Nurse Practitioners) who all work together to provide you with the care you need, when you need it.  We recommend signing up for the patient portal called "MyChart".  Sign up information is provided on this After Visit Summary.  MyChart is used to connect with patients for Virtual Visits (Telemedicine).  Patients are able to view lab/test results, encounter notes, upcoming appointments, etc.  Non-urgent messages can be sent to your provider as well.   To learn more about what you can do with MyChart, go to [email or web address].    Your next appointment:   1 year(s)  Provider:   Christell Constant, MD     Other Instructions  Your physician wants you to follow-up in: 1 year.  You will receive a reminder letter in the mail two months in advance. If you don't receive a letter, please call our office to schedule the follow-up appointment.    1st Floor: - Lobby - Registration  - Pharmacy  - Lab - Cafe  2nd Floor: - PV Lab - Diagnostic Testing (echo, CT, nuclear med)  3rd Floor: - Vacant  4th Floor: - TCTS (cardiothoracic surgery) - AFib Clinic - Structural Heart Clinic - Vascular Surgery  - Vascular Ultrasound  5th Floor: - [facility] (general and EP) - Clinical Pharmacy for coumadin, hypertension, lipid, weight-loss medications, and med management appointments    Valet parking services will be available as well.

## 2023-03-28 ENCOUNTER — Ambulatory Visit (HOSPITAL_COMMUNITY): Attending: Cardiology

## 2023-03-28 DIAGNOSIS — R0600 Dyspnea, unspecified: Secondary | ICD-10-CM | POA: Diagnosis not present

## 2023-03-28 DIAGNOSIS — I10 Essential (primary) hypertension: Secondary | ICD-10-CM | POA: Insufficient documentation

## 2023-03-28 DIAGNOSIS — I700 Atherosclerosis of aorta: Secondary | ICD-10-CM | POA: Insufficient documentation

## 2023-03-28 DIAGNOSIS — R0602 Shortness of breath: Secondary | ICD-10-CM | POA: Insufficient documentation

## 2023-03-28 LAB — ECHOCARDIOGRAM COMPLETE
Area-P 1/2: 3.68 cm2
P 1/2 time: 276 ms
S' Lateral: 2.8 cm

## 2023-03-28 MED ORDER — PERFLUTREN LIPID MICROSPHERE
1.0000 mL | INTRAVENOUS | Status: AC | PRN
Start: 2023-03-28 — End: 2023-03-28
  Administered 2023-03-28: 3 mL via INTRAVENOUS

## 2023-03-31 NOTE — Progress Notes (Signed)
 Patient wears F20 FFM.

## 2023-04-01 ENCOUNTER — Other Ambulatory Visit: Payer: Self-pay

## 2023-04-01 DIAGNOSIS — E782 Mixed hyperlipidemia: Secondary | ICD-10-CM

## 2023-04-01 DIAGNOSIS — R5383 Other fatigue: Secondary | ICD-10-CM

## 2023-04-01 DIAGNOSIS — I10 Essential (primary) hypertension: Secondary | ICD-10-CM

## 2023-04-01 DIAGNOSIS — I440 Atrioventricular block, first degree: Secondary | ICD-10-CM

## 2023-04-01 DIAGNOSIS — R0602 Shortness of breath: Secondary | ICD-10-CM

## 2023-04-01 DIAGNOSIS — I700 Atherosclerosis of aorta: Secondary | ICD-10-CM

## 2023-04-01 DIAGNOSIS — R400 Somnolence: Secondary | ICD-10-CM

## 2023-04-01 DIAGNOSIS — G4733 Obstructive sleep apnea (adult) (pediatric): Secondary | ICD-10-CM

## 2023-04-01 NOTE — Progress Notes (Signed)
 Per Dr. Mayford Knife: Change to auto BiPAP with IPAP max 18cm H2O, EPAP min 5cm H2O, and PS 4cm H2O. Order sent to [facility] 3/11/285

## 2023-04-20 DIAGNOSIS — G4733 Obstructive sleep apnea (adult) (pediatric): Secondary | ICD-10-CM | POA: Diagnosis not present

## 2023-04-23 DIAGNOSIS — E7800 Pure hypercholesterolemia, unspecified: Secondary | ICD-10-CM | POA: Diagnosis not present

## 2023-04-23 DIAGNOSIS — G250 Essential tremor: Secondary | ICD-10-CM | POA: Diagnosis not present

## 2023-04-23 DIAGNOSIS — Z85038 Personal history of other malignant neoplasm of large intestine: Secondary | ICD-10-CM | POA: Diagnosis not present

## 2023-04-23 DIAGNOSIS — I10 Essential (primary) hypertension: Secondary | ICD-10-CM | POA: Diagnosis not present

## 2023-05-21 DIAGNOSIS — G4733 Obstructive sleep apnea (adult) (pediatric): Secondary | ICD-10-CM | POA: Diagnosis not present

## 2023-08-06 DIAGNOSIS — H353131 Nonexudative age-related macular degeneration, bilateral, early dry stage: Secondary | ICD-10-CM | POA: Diagnosis not present

## 2023-08-19 DIAGNOSIS — G4733 Obstructive sleep apnea (adult) (pediatric): Secondary | ICD-10-CM | POA: Diagnosis not present

## 2023-10-20 DIAGNOSIS — I10 Essential (primary) hypertension: Secondary | ICD-10-CM | POA: Diagnosis not present

## 2023-10-20 DIAGNOSIS — G250 Essential tremor: Secondary | ICD-10-CM | POA: Diagnosis not present

## 2023-10-20 DIAGNOSIS — Z85038 Personal history of other malignant neoplasm of large intestine: Secondary | ICD-10-CM | POA: Diagnosis not present

## 2023-10-20 DIAGNOSIS — Z0000 Encounter for general adult medical examination without abnormal findings: Secondary | ICD-10-CM | POA: Diagnosis not present

## 2023-10-20 DIAGNOSIS — Z125 Encounter for screening for malignant neoplasm of prostate: Secondary | ICD-10-CM | POA: Diagnosis not present

## 2023-10-20 DIAGNOSIS — E7800 Pure hypercholesterolemia, unspecified: Secondary | ICD-10-CM | POA: Diagnosis not present

## 2023-10-20 DIAGNOSIS — M25611 Stiffness of right shoulder, not elsewhere classified: Secondary | ICD-10-CM | POA: Diagnosis not present

## 2023-10-20 DIAGNOSIS — Z23 Encounter for immunization: Secondary | ICD-10-CM | POA: Diagnosis not present
